# Patient Record
Sex: Female | Born: 1970 | Race: White | Hispanic: Yes | Marital: Married
Health system: Southern US, Community
[De-identification: ages and names within clinical notes are randomized; demographics above are authoritative.]

## PROBLEM LIST (undated history)

## (undated) DIAGNOSIS — C801 Malignant (primary) neoplasm, unspecified: Secondary | ICD-10-CM

## (undated) DIAGNOSIS — C229 Malignant neoplasm of liver, not specified as primary or secondary: Secondary | ICD-10-CM

---

## 2021-05-12 ENCOUNTER — Ambulatory Visit: Payer: Self-pay | Admitting: Nurse Practitioner

## 2021-09-29 ENCOUNTER — Encounter (HOSPITAL_BASED_OUTPATIENT_CLINIC_OR_DEPARTMENT_OTHER): Payer: Self-pay | Admitting: Emergency Medicine

## 2021-09-29 ENCOUNTER — Emergency Department (HOSPITAL_BASED_OUTPATIENT_CLINIC_OR_DEPARTMENT_OTHER)
Admission: EM | Admit: 2021-09-29 | Discharge: 2021-09-30 | Disposition: A | Discharge status: Other Acute Inpatient Hospital | Payer: Medicaid Other | Attending: Emergency Medicine | Admitting: Emergency Medicine

## 2021-09-29 ENCOUNTER — Other Ambulatory Visit: Payer: Self-pay

## 2021-09-29 ENCOUNTER — Emergency Department (HOSPITAL_BASED_OUTPATIENT_CLINIC_OR_DEPARTMENT_OTHER): Payer: Medicaid Other | Admitting: Radiology

## 2021-09-29 ENCOUNTER — Emergency Department (HOSPITAL_BASED_OUTPATIENT_CLINIC_OR_DEPARTMENT_OTHER): Payer: Medicaid Other

## 2021-09-29 DIAGNOSIS — R509 Fever, unspecified: Secondary | ICD-10-CM

## 2021-09-29 DIAGNOSIS — Z8509 Personal history of malignant neoplasm of other digestive organs: Secondary | ICD-10-CM | POA: Insufficient documentation

## 2021-09-29 DIAGNOSIS — R1011 Right upper quadrant pain: Secondary | ICD-10-CM | POA: Diagnosis not present

## 2021-09-29 DIAGNOSIS — R1013 Epigastric pain: Secondary | ICD-10-CM | POA: Diagnosis not present

## 2021-09-29 DIAGNOSIS — Z20822 Contact with and (suspected) exposure to covid-19: Secondary | ICD-10-CM | POA: Insufficient documentation

## 2021-09-29 HISTORY — DX: Malignant (primary) neoplasm, unspecified: C80.1

## 2021-09-29 LAB — URINALYSIS, ROUTINE W REFLEX MICROSCOPIC
Bilirubin Urine: NEGATIVE
Glucose, UA: NEGATIVE mg/dL
Ketones, ur: NEGATIVE mg/dL
Leukocytes,Ua: NEGATIVE
Nitrite: NEGATIVE
Specific Gravity, Urine: 1.013 (ref 1.005–1.030)
pH: 7 (ref 5.0–8.0)

## 2021-09-29 LAB — CBC WITH DIFFERENTIAL/PLATELET
Abs Immature Granulocytes: 0.02 10*3/uL (ref 0.00–0.07)
Basophils Absolute: 0 10*3/uL (ref 0.0–0.1)
Basophils Relative: 0 %
Eosinophils Absolute: 0 10*3/uL (ref 0.0–0.5)
Eosinophils Relative: 1 %
HCT: 37.5 % (ref 36.0–46.0)
Hemoglobin: 12.2 g/dL (ref 12.0–15.0)
Immature Granulocytes: 0 %
Lymphocytes Relative: 11 %
Lymphs Abs: 0.8 10*3/uL (ref 0.7–4.0)
MCH: 28.3 pg (ref 26.0–34.0)
MCHC: 32.5 g/dL (ref 30.0–36.0)
MCV: 87 fL (ref 80.0–100.0)
Monocytes Absolute: 0.9 10*3/uL (ref 0.1–1.0)
Monocytes Relative: 13 %
Neutro Abs: 5.1 10*3/uL (ref 1.7–7.7)
Neutrophils Relative %: 75 %
Platelets: 152 10*3/uL (ref 150–400)
RBC: 4.31 MIL/uL (ref 3.87–5.11)
RDW: 16.2 % — ABNORMAL HIGH (ref 11.5–15.5)
WBC: 6.8 10*3/uL (ref 4.0–10.5)
nRBC: 0 % (ref 0.0–0.2)

## 2021-09-29 LAB — COMPREHENSIVE METABOLIC PANEL
ALT: 120 U/L — ABNORMAL HIGH (ref 0–44)
AST: 72 U/L — ABNORMAL HIGH (ref 15–41)
Albumin: 4 g/dL (ref 3.5–5.0)
Alkaline Phosphatase: 406 U/L — ABNORMAL HIGH (ref 38–126)
Anion gap: 10 (ref 5–15)
BUN: 8 mg/dL (ref 6–20)
CO2: 23 mmol/L (ref 22–32)
Calcium: 10 mg/dL (ref 8.9–10.3)
Chloride: 101 mmol/L (ref 98–111)
Creatinine, Ser: 0.9 mg/dL (ref 0.44–1.00)
GFR, Estimated: >60 mL/min (ref >60)
Glucose, Bld: 111 mg/dL — ABNORMAL HIGH (ref 70–99)
Potassium: 3.6 mmol/L (ref 3.5–5.1)
Sodium: 134 mmol/L — ABNORMAL LOW (ref 135–145)
Total Bilirubin: 0.8 mg/dL (ref 0.3–1.2)
Total Protein: 7.2 g/dL (ref 6.5–8.1)

## 2021-09-29 LAB — RESP PANEL BY RT-PCR (FLU A&B, COVID) ARPGX2
Influenza A by PCR: NEGATIVE
Influenza B by PCR: NEGATIVE
SARS Coronavirus 2 by RT PCR: NEGATIVE

## 2021-09-29 LAB — PROTIME-INR
INR: 1 (ref 0.8–1.2)
Prothrombin Time: 12.6 seconds (ref 11.4–15.2)

## 2021-09-29 LAB — HCG, SERUM, QUALITATIVE: Preg, Serum: NEGATIVE

## 2021-09-29 LAB — LACTIC ACID, PLASMA: Lactic Acid, Venous: 0.7 mmol/L (ref 0.5–1.9)

## 2021-09-29 MED ORDER — ACETAMINOPHEN 325 MG PO TABS
650.0000 mg | ORAL_TABLET | Freq: Once | ORAL | Status: DC
Start: 2021-09-29 — End: 2021-09-30
  Filled 2021-09-29: qty 2

## 2021-09-29 MED ORDER — CEFTRIAXONE SODIUM 1 G IJ SOLR
1.0000 g | Freq: Once | INTRAMUSCULAR | Status: AC
  Administered 2021-09-29: 1 g via INTRAVENOUS
  Filled 2021-09-29: qty 10

## 2021-09-29 MED ORDER — METRONIDAZOLE 500 MG/100ML IV SOLN
500.0000 mg | Freq: Once | INTRAVENOUS | Status: AC
  Administered 2021-09-29: 500 mg via INTRAVENOUS
  Filled 2021-09-29: qty 100

## 2021-09-29 MED ORDER — IOHEXOL 300 MG/ML  SOLN
100.0000 mL | Freq: Once | INTRAMUSCULAR | Status: AC | PRN
  Administered 2021-09-29: 75 mL via INTRAVENOUS

## 2021-09-29 MED ORDER — VANCOMYCIN HCL 500 MG IV SOLR
500.0000 mg | Freq: Two times a day (BID) | INTRAVENOUS | Status: DC
  Administered 2021-09-30: 500 mg via INTRAVENOUS

## 2021-09-29 MED ORDER — VANCOMYCIN HCL IN DEXTROSE 1-5 GM/200ML-% IV SOLN
1000.0000 mg | Freq: Once | INTRAVENOUS | Status: AC
  Administered 2021-09-29: 1000 mg via INTRAVENOUS
  Filled 2021-09-29: qty 200

## 2021-09-29 MED ORDER — SODIUM CHLORIDE 0.9 % IV BOLUS
30.0000 mL/kg | Freq: Once | INTRAVENOUS | Status: AC
  Administered 2021-09-29: 1000 mL via INTRAVENOUS

## 2021-09-29 NOTE — ED Notes (Signed)
Handoff report given to Lequita Asal at [facility] ER. ?

## 2021-09-29 NOTE — ED Notes (Signed)
Patient transported to CT 

## 2021-09-29 NOTE — ED Notes (Signed)
Md made aware of temp 100.41F // VO for tylenol given per Gloris Manchester ?

## 2021-09-29 NOTE — ED Provider Notes (Signed)
Discussed with [facility] transfer service, they will accept her in ED to ED transfer to [place] accepting physician is Dr. Renda Rolls of the [facility] oncology service.  Also discussed with GI Dr. Cephas Darby who concurs with patient being admitted to oncology service GI will consult. ? ?Patient's labs are reassuring lactic acid not elevated white blood cell count not elevated.  But patient with significant fevers tachycardia and increased respiratory rate chest x-ray was negative as well.  Patient was treated with broad-spectrum antibiotics Vanco Rocephin and Flagyl and a 30 cc/kg fluid bolus.  By my daytime emergency physician colleague.  COVID testing is negative ? ?Patient known to have a history of cholangiocarcinoma.  The concern is for some cholangitis.  Patient does have a biliary stent in place.  That drains into the duodenum.  He just had an ERCP done at [facility] on April 24. ? ?CRITICAL CARE ?Performed by: Fredia Sorrow ?Total critical care time: 35 minutes ?Critical care time was exclusive of separately billable procedures and treating other patients. ?Critical care was necessary to treat or prevent imminent or life-threatening deterioration. ?Critical care was time spent personally by me on the following activities: development of treatment plan with patient and/or surrogate as well as nursing, discussions with consultants, evaluation of patient's response to treatment, examination of patient, obtaining history from patient or surrogate, ordering and performing treatments and interventions, ordering and review of laboratory studies, ordering and review of radiographic studies, pulse oximetry and re-evaluation of patient's condition. ? ?  ?Fredia Sorrow, MD ?09/29/21 1710 ? ?

## 2021-09-29 NOTE — ED Triage Notes (Signed)
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 Pt reports increasingly worse abdominal pain. Pt reports fever a 101.5 this morning before coming to ED Pt is worried about sepsis.  ?

## 2021-09-29 NOTE — ED Provider Notes (Signed)
?[place] EMERGENCY DEPT ?Provider Note ? ? ?CSN: [id number] ?Arrival date & time: 09/29/21  0906 ? ?  ? ?History ? ?Chief Complaint  ?Patient presents with  ? Abdominal Pain  ? ? ?Melinda White is a 51 y.o. female with a history of cholangiocarcinoma treated by [facility] oncology, s/p cholecystectomy, recent ERCP 09/26/21 at [facility] with biliary and hepatic stenting, presenting to ED with fevers and abdominal pain.  She reports he began having symptoms approximately 2 weeks ago.  Supplemental history provided by her husband at bedside.  She reports that she has been having fevers nearly daily for 2 weeks, and also was having epigastric and right upper quadrant abdominal pain.  She does not have persistent pain from her malignancy, it has been doing well otherwise until this began 2 weeks ago.  She says it feels similar to when she had a "infection in my bile stents".  They underwent ERCP 3 days ago at [facility], to have her biliary stents replaced, as it was felt that the stents have been in for too long. ? ?The patient was also evaluated for fevers approximately 10 days ago at [facility], had blood cultures drawn on 09/19/21 with no growth. ? ?Her most recent weekly chemoinfusion was held last week due to these fevers. ? ?ERCP report from [facility]where, [facility], ERCP impression: ? ?Impression: - Two stents from the biliary tree were seen in the  ?major papilla, removed with a snare. ?- Prior biliary sphincterotomy appeared open. ?- A single moderate biliary stricture was found. The  ?stricture was malignant appearing. ?- The the left and right intrahepatic ducts were  ?moderately dilated, with a mass causing an obstruction. ?- The middle third of the main bile duct and lower  ?third of the main bile duct were mildly dilated,  ?acquired. ?- The left main hepatic duct was successfully dilated. ?- The right main hepatic duct was successfully dilated. ?- The biliary tree was swept and debris was found. ?- One plastic  pancreatic stent was placed into the  ?left hepatic duct. ?- One plastic biliary stent was placed into the right  ?hepatic duct. ? ?Recommendation: - Discharge patient to home (via wheelchair). ?- Repeat ERCP in 3-4 months to exchange stent. ?- Cipro (ciprofloxacin) 500 mg PO BID for 5 days. ?- Watch for pancreatitis, bleeding, perforation, and  ?cholangitis. ?- The findings and recommendations were discussed with  ?the patient and their family. ?Attending Participation: ?I personally performed the entire procedure. ?Kathreen Cosier, MD ? ?HPI ? ?  ? ?Home Medications ?Prior to Admission medications   ?Not on File  ?   ? ?Allergies    ?Reglan [metoclopramide] and Penicillins   ? ?Review of Systems   ?Review of Systems ? ?Physical Exam ?Updated Vital Signs ?BP (!) 127/93   Pulse (!) 115   Temp (!) 101 ?F (38.3 ?C) (Oral)   Resp 15   Ht 5' 6"  (1.676 m)   Wt 55.8 kg   SpO2 98%   BMI 19.85 kg/m?  ?Physical Exam ?Constitutional:   ?   General: She is not in acute distress. ?HENT:  ?   Head: Normocephalic and atraumatic.  ?Eyes:  ?   Conjunctiva/sclera: Conjunctivae normal.  ?   Pupils: Pupils are equal, round, and reactive to light.  ?Cardiovascular:  ?   Rate and Rhythm: Normal rate and regular rhythm.  ?Pulmonary:  ?   Effort: Pulmonary effort is normal. No respiratory distress.  ?Abdominal:  ?   General: There is  no distension.  ?   Tenderness: There is abdominal tenderness in the epigastric area.  ?Skin: ?   General: Skin is warm and dry.  ?Neurological:  ?   General: No focal deficit present.  ?   Mental Status: She is alert. Mental status is at baseline.  ?Psychiatric:     ?   Mood and Affect: Mood normal.     ?   Behavior: Behavior normal.  ? ? ?ED Results / Procedures / Treatments   ?Labs ?(all labs ordered are listed, but only abnormal results are displayed) ?Labs Reviewed  ?CULTURE, BLOOD (ROUTINE X 2)  ?CULTURE, BLOOD (ROUTINE X 2)  ?COMPREHENSIVE METABOLIC PANEL  ?LACTIC ACID, PLASMA  ?LACTIC ACID,  PLASMA  ?CBC WITH DIFFERENTIAL/PLATELET  ?PROTIME-INR  ?URINALYSIS, ROUTINE W REFLEX MICROSCOPIC  ?HCG, SERUM, QUALITATIVE  ? ? ?EKG ?None ? ?Radiology ?No results found. ? ?Procedures ?Marland KitchenCritical Care ?Performed by: Wyvonnia Dusky, MD ?Authorized by: Wyvonnia Dusky, MD  ? ?Critical care provider statement:  ?  Critical care time (minutes):  45 ?  Critical care time was exclusive of:  Separately billable procedures and treating other patients ?  Critical care was necessary to treat or prevent imminent or life-threatening deterioration of the following conditions:  Sepsis ?  Critical care was time spent personally by me on the following activities:  Ordering and performing treatments and interventions, ordering and review of laboratory studies, ordering and review of radiographic studies, pulse oximetry, review of old charts, examination of patient and evaluation of patient's response to treatment ?  Care discussed with: admitting provider    ? ? ?Medications Ordered in ED ?Medications - No data to display ? ?ED Course/ Medical Decision Making/ A&P ?Clinical Course as of 09/30/21 1059  ?Thu Sep 29, 2021  ?1006 Patient notes she has been on ciprofloxacin for several days, has 3 more days of her prescription left, to complete a 10-day course. [MT]  ?1044 AST/ALT downtrending from 09/19/21 [facility] records, and have been stable in the past 3 weeks.  Alk Phos mildly elevated but nonspecific - she is s/p cholecystectomy. [MT]  ?8264 I ordered a CT scan to evaluate for intra-abdominal infection or abscess.  The patient and her husband are in agreement with the plan.  She reports her pain overall has improved and is quite minimal at this time [MT]  ?1419 CT scan shows no significant findings or abnormalities to change from prior scan to suggest intra-abdominal abscess.  I discussed with the patient and her husband a plan for medical admission on antibiotics for sepsis rule out and for echocardiogram to evaluate for  endocarditis, given that she does have a central line for chemo infusions, which would be a potential source of infection.  I have not otherwise identified any other source of her fever and tachycardia.  She is not hypoxic to suggest acute pulmonary embolism, and this would less likely cause fevers for 2 weeks.  I would like to avoid another CT scan with contrast at this time and I will defer decision for PE workup to the inpatient team.  Patient and husband are agreeable for admission [MT]  ?Bryce at [facility] and GI Dr Johney Frame [facility]) have both requested that I discuss case with [facility] for transfer -patient and her husband initially had requested medical admission at [facility] but now are agreeable to transfer if this is an option.  Will contact [facility] [MT]  ?1545 Pt signed out to Dr Rogene Houston EDP pending conversations  with [facility] for transfer.  Awaiting callback from [place] initially. [MT]  ?  ?Clinical Course User Index ?[MT] Wyvonnia Dusky, MD  ? ?                        ?Medical Decision Making ?Amount and/or Complexity of Data Reviewed ?Labs: ordered. ?Radiology: ordered. ? ?Risk ?Prescription drug management. ? ? ?This patient presents to the ED with concern for fever, abdominal pain. This involves an extensive number of treatment options, and is a complaint that carries with it a high risk of complications and morbidity.  The differential diagnosis includes sepsis versus chemotherapy side effects versus biliary infection versus other infection ? ?Presentation could be concerning for sepsis with tachycardia and fever.  Blood cultures will be sent.  Her blood cultures last week at [facility] were no growth to date per my review of external records. ? ?Her abdominal pain appears to be the new symptom, and is difficult to discern whether this would be expected postoperative with her recent ERCP, although she does note that she had pain preceding the ERCP. ? ?Co-morbidities that complicate the patient evaluation:  Immunocompromise that she is on chemotherapy ? ?Additional history obtained from patient's husband at bedside ? ?External records from outside source obtained and reviewed including [facility] oncology records, recent ERCP report,

## 2021-09-29 NOTE — Progress Notes (Signed)
Pharmacy Antibiotic Note ? ?Melinda White is a 51 y.o. female admitted on 09/29/2021 presenting with fevers and abdominal pain, hx ERCP recently with biliary stents.  Pharmacy has been consulted for vancomycin dosing.  Ceftriaxone per MD ? ?Plan: ?Vancomycin 1000 mg IV x 1, then 500 mg IV q 12h  ?Monitor renal function, Cx and clinical progression to narrow ?Vancomycin levels as needed ? ?Height: '5\' 6"'$  (167.6 cm) ?Weight: 55.8 kg (123 lb) ?IBW/kg (Calculated) : 59.3 ? ?Temp (24hrs), Avg:101.1 ?F (38.4 ?C), Min:101 ?F (38.3 ?C), Max:101.1 ?F (38.4 ?C) ? ?Recent Labs  ?Lab 09/29/21 ?5945  ?WBC 6.8  ?CREATININE 0.90  ?LATICACIDVEN 0.7  ?  ?Estimated Creatinine Clearance: 67.3 mL/min (by C-G formula based on SCr of 0.9 mg/dL).   ? ?Allergies  ?Allergen Reactions  ? Reglan [Metoclopramide] Anaphylaxis  ? Penicillins Rash  ? ? ?Bertis Ruddy, PharmD ?Clinical Pharmacist ?ED Pharmacist Phone # [phone] ?09/29/2021 2:29 PM ? ? ?

## 2021-09-29 NOTE — ED Notes (Addendum)
Pt refused to take tylenol. Pt educated on why she was receiving tylenol. Pt then stated that she does not take tylenol because of the liver CA. RN then asked what do she normally take for a elevated temp and she then stated nothing. Pt denies wanting to take anything for elevated temp at this time. ?

## 2021-09-30 LAB — BLOOD CULTURE ID PANEL (REFLEXED) - BCID2

## 2021-09-30 NOTE — ED Notes (Addendum)
Report given to [facility]  ?

## 2021-10-02 LAB — CULTURE, BLOOD (ROUTINE X 2): Special Requests: ADEQUATE

## 2021-10-03 LAB — CULTURE, BLOOD (ROUTINE X 2): Special Requests: ADEQUATE

## 2021-10-10 ENCOUNTER — Other Ambulatory Visit (HOSPITAL_COMMUNITY)
Admission: RE | Admit: 2021-10-10 | Discharge: 2021-10-10 | Disposition: A | Discharge status: Home / Self Care | Payer: Medicaid Other | Source: Ambulatory Visit | Attending: Emergency Medicine | Admitting: Emergency Medicine

## 2021-10-10 DIAGNOSIS — Z01812 Encounter for preprocedural laboratory examination: Secondary | ICD-10-CM | POA: Diagnosis present

## 2021-10-10 LAB — CBC WITH DIFFERENTIAL/PLATELET
Abs Immature Granulocytes: 0.01 10*3/uL (ref 0.00–0.07)
Basophils Absolute: 0 10*3/uL (ref 0.0–0.1)
Basophils Relative: 1 %
Eosinophils Absolute: 0.2 10*3/uL (ref 0.0–0.5)
Eosinophils Relative: 4 %
HCT: 34.4 % — ABNORMAL LOW (ref 36.0–46.0)
Hemoglobin: 11.2 g/dL — ABNORMAL LOW (ref 12.0–15.0)
Immature Granulocytes: 0 %
Lymphocytes Relative: 42 %
Lymphs Abs: 2.2 10*3/uL (ref 0.7–4.0)
MCH: 29.1 pg (ref 26.0–34.0)
MCHC: 32.6 g/dL (ref 30.0–36.0)
MCV: 89.4 fL (ref 80.0–100.0)
Monocytes Absolute: 0.7 10*3/uL (ref 0.1–1.0)
Monocytes Relative: 13 %
Neutro Abs: 2.1 10*3/uL (ref 1.7–7.7)
Neutrophils Relative %: 40 %
Platelets: 271 10*3/uL (ref 150–400)
RBC: 3.85 MIL/uL — ABNORMAL LOW (ref 3.87–5.11)
RDW: 15.9 % — ABNORMAL HIGH (ref 11.5–15.5)
WBC: 5.2 10*3/uL (ref 4.0–10.5)
nRBC: 0 % (ref 0.0–0.2)

## 2021-10-10 LAB — COMPREHENSIVE METABOLIC PANEL
ALT: 39 U/L (ref 0–44)
AST: 40 U/L (ref 15–41)
Albumin: 3.3 g/dL — ABNORMAL LOW (ref 3.5–5.0)
Alkaline Phosphatase: 351 U/L — ABNORMAL HIGH (ref 38–126)
Anion gap: 7 (ref 5–15)
BUN: 6 mg/dL (ref 6–20)
CO2: 28 mmol/L (ref 22–32)
Calcium: 9.2 mg/dL (ref 8.9–10.3)
Chloride: 106 mmol/L (ref 98–111)
Creatinine, Ser: 0.88 mg/dL (ref 0.44–1.00)
GFR, Estimated: >60 mL/min (ref >60)
Glucose, Bld: 102 mg/dL — ABNORMAL HIGH (ref 70–99)
Potassium: 3.9 mmol/L (ref 3.5–5.1)
Sodium: 141 mmol/L (ref 135–145)
Total Bilirubin: 0.2 mg/dL — ABNORMAL LOW (ref 0.3–1.2)
Total Protein: 6.5 g/dL (ref 6.5–8.1)

## 2021-10-14 ENCOUNTER — Other Ambulatory Visit: Payer: Self-pay

## 2021-10-14 ENCOUNTER — Emergency Department (HOSPITAL_BASED_OUTPATIENT_CLINIC_OR_DEPARTMENT_OTHER)
Admission: EM | Admit: 2021-10-14 | Discharge: 2021-10-14 | Disposition: A | Discharge status: Other Acute Inpatient Hospital | Payer: Medicaid Other | Attending: Emergency Medicine | Admitting: Emergency Medicine

## 2021-10-14 ENCOUNTER — Emergency Department (HOSPITAL_BASED_OUTPATIENT_CLINIC_OR_DEPARTMENT_OTHER): Payer: Medicaid Other | Admitting: Radiology

## 2021-10-14 ENCOUNTER — Emergency Department (HOSPITAL_BASED_OUTPATIENT_CLINIC_OR_DEPARTMENT_OTHER): Payer: Medicaid Other

## 2021-10-14 ENCOUNTER — Encounter (HOSPITAL_BASED_OUTPATIENT_CLINIC_OR_DEPARTMENT_OTHER): Payer: Self-pay

## 2021-10-14 DIAGNOSIS — R7989 Other specified abnormal findings of blood chemistry: Secondary | ICD-10-CM | POA: Insufficient documentation

## 2021-10-14 DIAGNOSIS — C221 Intrahepatic bile duct carcinoma: Secondary | ICD-10-CM | POA: Diagnosis not present

## 2021-10-14 DIAGNOSIS — R509 Fever, unspecified: Secondary | ICD-10-CM

## 2021-10-14 DIAGNOSIS — D72829 Elevated white blood cell count, unspecified: Secondary | ICD-10-CM | POA: Diagnosis not present

## 2021-10-14 DIAGNOSIS — R1011 Right upper quadrant pain: Secondary | ICD-10-CM | POA: Diagnosis present

## 2021-10-14 DIAGNOSIS — R000 Tachycardia, unspecified: Secondary | ICD-10-CM | POA: Diagnosis not present

## 2021-10-14 LAB — COMPREHENSIVE METABOLIC PANEL
ALT: 39 U/L (ref 0–44)
AST: 57 U/L — ABNORMAL HIGH (ref 15–41)
Albumin: 4.3 g/dL (ref 3.5–5.0)
Alkaline Phosphatase: 358 U/L — ABNORMAL HIGH (ref 38–126)
Anion gap: 10 (ref 5–15)
BUN: 9 mg/dL (ref 6–20)
CO2: 23 mmol/L (ref 22–32)
Calcium: 9.4 mg/dL (ref 8.9–10.3)
Chloride: 104 mmol/L (ref 98–111)
Creatinine, Ser: 0.82 mg/dL (ref 0.44–1.00)
GFR, Estimated: >60 mL/min (ref >60)
Glucose, Bld: 106 mg/dL — ABNORMAL HIGH (ref 70–99)
Potassium: 3.8 mmol/L (ref 3.5–5.1)
Sodium: 137 mmol/L (ref 135–145)
Total Bilirubin: 0.5 mg/dL (ref 0.3–1.2)
Total Protein: 7.2 g/dL (ref 6.5–8.1)

## 2021-10-14 LAB — CBC WITH DIFFERENTIAL/PLATELET
Abs Immature Granulocytes: 0.03 10*3/uL (ref 0.00–0.07)
Basophils Absolute: 0 10*3/uL (ref 0.0–0.1)
Basophils Relative: 0 %
Eosinophils Absolute: 0 10*3/uL (ref 0.0–0.5)
Eosinophils Relative: 0 %
HCT: 35.8 % — ABNORMAL LOW (ref 36.0–46.0)
Hemoglobin: 11.5 g/dL — ABNORMAL LOW (ref 12.0–15.0)
Immature Granulocytes: 0 %
Lymphocytes Relative: 9 %
Lymphs Abs: 1.3 10*3/uL (ref 0.7–4.0)
MCH: 27.6 pg (ref 26.0–34.0)
MCHC: 32.1 g/dL (ref 30.0–36.0)
MCV: 86.1 fL (ref 80.0–100.0)
Monocytes Absolute: 1.2 10*3/uL — ABNORMAL HIGH (ref 0.1–1.0)
Monocytes Relative: 9 %
Neutro Abs: 10.9 10*3/uL — ABNORMAL HIGH (ref 1.7–7.7)
Neutrophils Relative %: 82 %
Platelets: 261 10*3/uL (ref 150–400)
RBC: 4.16 MIL/uL (ref 3.87–5.11)
RDW: 15.3 % (ref 11.5–15.5)
WBC: 13.4 10*3/uL — ABNORMAL HIGH (ref 4.0–10.5)
nRBC: 0 % (ref 0.0–0.2)

## 2021-10-14 LAB — PROTIME-INR
INR: 1 (ref 0.8–1.2)
Prothrombin Time: 12.9 seconds (ref 11.4–15.2)

## 2021-10-14 LAB — URINALYSIS, ROUTINE W REFLEX MICROSCOPIC
Bilirubin Urine: NEGATIVE
Glucose, UA: NEGATIVE mg/dL
Hgb urine dipstick: NEGATIVE
Ketones, ur: NEGATIVE mg/dL
Leukocytes,Ua: NEGATIVE
Nitrite: NEGATIVE
Protein, ur: NEGATIVE mg/dL
Specific Gravity, Urine: 1.013 (ref 1.005–1.030)
pH: 8 (ref 5.0–8.0)

## 2021-10-14 LAB — LACTIC ACID, PLASMA
Lactic Acid, Venous: 1 mmol/L (ref 0.5–1.9)
Lactic Acid, Venous: 1.1 mmol/L (ref 0.5–1.9)

## 2021-10-14 LAB — PREGNANCY, URINE: Preg Test, Ur: NEGATIVE

## 2021-10-14 LAB — LIPASE, BLOOD: Lipase: 23 U/L (ref 11–51)

## 2021-10-14 MED ORDER — ACETAMINOPHEN 325 MG PO TABS: 650.0000 mg | ORAL_TABLET | Freq: Once | ORAL | Status: DC

## 2021-10-14 MED ORDER — IOHEXOL 300 MG/ML  SOLN
100.0000 mL | Freq: Once | INTRAMUSCULAR | Status: AC | PRN
  Administered 2021-10-14: 80 mL via INTRAVENOUS

## 2021-10-14 MED ORDER — METRONIDAZOLE 500 MG/100ML IV SOLN
500.0000 mg | Freq: Two times a day (BID) | INTRAVENOUS | Status: DC
  Administered 2021-10-14: 500 mg via INTRAVENOUS
  Filled 2021-10-14: qty 100

## 2021-10-14 MED ORDER — IBUPROFEN 400 MG PO TABS
600.0000 mg | ORAL_TABLET | Freq: Once | ORAL | Status: AC
  Administered 2021-10-14: 600 mg via ORAL
  Filled 2021-10-14: qty 1

## 2021-10-14 MED ORDER — SODIUM CHLORIDE 0.9 % IV SOLN
INTRAVENOUS | Status: DC | PRN
  Administered 2021-10-14: 10 mL via INTRAVENOUS

## 2021-10-14 MED ORDER — SODIUM CHLORIDE 0.9 % IV SOLN: 2.0000 g | Freq: Three times a day (TID) | INTRAVENOUS | Status: DC

## 2021-10-14 MED ORDER — LACTATED RINGERS IV BOLUS
1000.0000 mL | Freq: Once | INTRAVENOUS | Status: AC
  Administered 2021-10-14: 1000 mL via INTRAVENOUS

## 2021-10-14 MED ORDER — VANCOMYCIN HCL IN DEXTROSE 1-5 GM/200ML-% IV SOLN
1000.0000 mg | Freq: Once | INTRAVENOUS | Status: AC
  Administered 2021-10-14: 1000 mg via INTRAVENOUS
  Filled 2021-10-14: qty 200

## 2021-10-14 MED ORDER — VANCOMYCIN HCL 750 MG/150ML IV SOLN
750.0000 mg | Freq: Two times a day (BID) | INTRAVENOUS | Status: DC
  Filled 2021-10-14: qty 150

## 2021-10-14 MED ORDER — SODIUM CHLORIDE 0.9 % IV SOLN
2.0000 g | Freq: Once | INTRAVENOUS | Status: AC
  Administered 2021-10-14: 2 g via INTRAVENOUS
  Filled 2021-10-14: qty 12.5

## 2021-10-14 NOTE — ED Notes (Addendum)
Patient transported to CT. Pt said she could not take tylenol for fever due to liver issues ? ?

## 2021-10-14 NOTE — ED Notes (Addendum)
Pt stated that she thought she was having an allergic reaction to the Vanc. Pt stated that the palms of her hands are red and itching. Pt denis Pain or SOB. Infusion stopped. MD notified Hr sligtly increased from baseline since she has been her. increased  ?

## 2021-10-14 NOTE — ED Provider Notes (Signed)
?[name] ?Provider Note ? ? ?CSN: [id number] ?Arrival date & time: 10/14/21  1419 ? ?  ? ?History ? ?Chief Complaint  ?Patient presents with  ? Code Sepsis  ? ? ?Melinda White is a 51 y.o. female. ? ?HPI ?Patient denies a fever abdominal pain.  Has had recent admission to hospital with bacteremia with concern for cholangitis.  Had positive blood cultures with Enterococcus and Bacteroides had been on p.o. Augmentin up until 3 days ago.  Had been doing well with some pain but states that it was just a similar pain she been having.  Now pain more in her right upper abdomen.  Is on chemotherapy for cholangiocarcinoma.  Is managed at [facility]  Fevers reportedly went up to 103 today with worsening pain from her stents.  No cough.  No dysuria.  Patient discussed with her doctors at [facility] and was told to come to the ER. ?  ?Past Medical History:  ?Diagnosis Date  ? Cancer [facility])   ? ?No past surgical history on file. ? ?Home Medications ?Prior to Admission medications   ?Medication Sig Start Date End Date Taking? Authorizing Provider  ?Cholecalciferol 50 MCG (2000 UT) CAPS Take 2,000 Units by mouth in the morning.    [provider]  ?ciprofloxacin (CIPRO) 500 MG tablet Take 500 mg by mouth 2 (two) times daily. 09/26/21   [provider]  ?dexamethasone (DECADRON) 4 MG tablet Take 2 tablets by mouth on days 2, 3 after chemo in the morning 10/21/20   [provider]  ?fexofenadine (ALLEGRA) 180 MG tablet Take 90 mg by mouth as needed.    [provider]  ?lidocaine-prilocaine (EMLA) cream Chemo days 12/27/20   [provider]  ?OLANZapine (ZYPREXA) 5 MG tablet Take 5 mg by mouth at bedtime.    [provider]  ?ondansetron (ZOFRAN-ODT) 8 MG disintegrating tablet Take 8 mg by mouth every 8 (eight) hours. On chemo days 04/15/21   [provider]  ?pantoprazole (PROTONIX) 40 MG tablet Take 40 mg by mouth daily. 09/14/21   [provider]  ?polyethylene glycol powder (GLYCOLAX/MIRALAX) 17 GM/SCOOP powder Take 17 g by mouth 2 (two) times daily. 09/27/20   [provider]  ?senna (SENOKOT) 8.6 MG tablet Take 1 tablet by mouth 2 (two) times daily. 09/27/20   [provider]  ?sucralfate (CARAFATE) 1 g tablet Take 1 g by mouth 4 (four) times daily. 08/22/21   [provider]  ?   ? ?Allergies    ?Reglan [metoclopramide] and Penicillins   ? ?Review of Systems   ?Review of Systems  ?Constitutional:  Positive for chills, fatigue and fever.  ?Respiratory:  Negative for shortness of breath.   ?Gastrointestinal:  Positive for abdominal pain.  ?Genitourinary:  Positive for flank pain.  ?Musculoskeletal:  Positive for back pain.  ?Skin:  Negative for rash.  ?Neurological:  Negative for weakness.  ? ?Physical Exam ?Updated Vital Signs ?BP (!) 141/97   Pulse (!) 108   Temp (!) 100.6 ?F (38.1 ?C)   Resp (!) 22   Ht '5\' 6"'$  (1.676 m)   Wt 55.8 kg   SpO2 99%   BMI 19.85 kg/m?  ?Physical Exam ?Vitals and nursing note reviewed.  ?Constitutional:   ?   Comments: Patient appears uncomfortable.  ?Cardiovascular:  ?   Rate and Rhythm: Tachycardia present.  ?Abdominal:  ?   Tenderness: There is abdominal tenderness.  ?   Comments: Right upper quadrant tenderness.  Also mild tenderness over pump and lower abdomen.  ?Musculoskeletal:     ?   General: No tenderness.  ?   Cervical back: Neck supple.  ?Skin: ?   General: Skin is warm.  ?   Capillary Refill: Capillary refill takes less than 2 seconds.  ?Neurological:  ?   Mental Status: She is alert and oriented to person, place, and time.  ? ? ?ED Results / Procedures / Treatments   ?Labs ?(all labs ordered are listed, but only abnormal results are displayed) ?Labs Reviewed  ?COMPREHENSIVE METABOLIC PANEL - Abnormal; Notable for the following components:  ?    Result Value  ? Glucose, Bld 106 (*)   ? AST 57 (*)   ? Alkaline Phosphatase 358 (*)   ? All other components within normal limits  ?CBC  WITH DIFFERENTIAL/PLATELET - Abnormal; Notable for the following components:  ? WBC 13.4 (*)   ? Hemoglobin 11.5 (*)   ? HCT 35.8 (*)   ? Neutro Abs 10.9 (*)   ? Monocytes Absolute 1.2 (*)   ? All other components within normal limits  ?CULTURE, BLOOD (ROUTINE X 2)  ?CULTURE, BLOOD (ROUTINE X 2)  ?LACTIC ACID, PLASMA  ?LACTIC ACID, PLASMA  ?PROTIME-INR  ?URINALYSIS, ROUTINE W REFLEX MICROSCOPIC  ?PREGNANCY, URINE  ?LIPASE, BLOOD  ? ? ?EKG ?None ? ?Radiology ?CT ABDOMEN PELVIS W CONTRAST ? ?Result Date: 10/14/2021 ?CLINICAL DATA:  Sepsis, bacteremia, finished antibiotics and started having fevers. Return of abdominal pain. Tmax 103 degrees. History of cholangiocarcinoma on chemotherapy EXAM: CT ABDOMEN AND PELVIS WITH CONTRAST TECHNIQUE: Multidetector CT imaging of the abdomen and pelvis was performed using the standard protocol following bolus administration of intravenous contrast. RADIATION DOSE REDUCTION: This exam was performed according to the departmental dose-optimization program which includes automated exposure control, adjustment of the mA and/or kV according to patient size and/or use of iterative reconstruction technique. CONTRAST:  93m OMNIPAQUE IOHEXOL 300 MG/ML SOLN IV. No oral contrast. COMPARISON:  09/29/2021 FINDINGS: Lower chest: Lung bases clear Hepatobiliary: Post cholecystectomy. CBD stents. Low-attenuation mass at porta hepatis consistent with cholangiocarcinoma, 4.1 x 3.2 cm previously 4.0 x 3.4 cm. Pancreas: Normal appearance Spleen: Normal appearance Adrenals/Urinary Tract: Small LEFT renal cyst 11 mm diameter at inferior pole; no follow-up imaging recommended. Adrenal glands, kidneys, ureters, and bladder otherwise normal appearance. Stomach/Bowel: Stomach decompressed. Bowel loops unremarkable. Appendix not visualized. Vascular/Lymphatic: Vascular structures patent.  No adenopathy. Reproductive: Unremarkable uterus and adnexa Other: No free air or free fluid. Infusion pump anterior  abdominal wall with catheter in RIGHT abdomen. Musculoskeletal: Unremarkable IMPRESSION: Low-attenuation mass at porta hepatis consistent with cholangiocarcinoma, 4.1 x 3.2 cm previously 4.0 x 3.4 cm. CBD stents. No new intra-abdominal or intrapelvic abnormalities. Electronically Signed   By: MLavonia DanaM.D.   On: 10/14/2021 16:22  ? ?DG Chest Port 1 View ? ?Result Date: 10/14/2021 ?CLINICAL DATA:  Fever EXAM: PORTABLE CHEST 1 VIEW COMPARISON:  09/29/2021 FINDINGS: Right-sided central venous port tip over the SVC. No focal opacity or pleural effusion. Normal cardiac size. No pneumothorax. IMPRESSION: No active disease. Electronically Signed   By: KDonavan FoilM.D.   On: 10/14/2021 16:11   ? ?Procedures ?Procedures  ? ? ?Medications Ordered in ED ?Medications  ?vancomycin (VANCOCIN) IVPB 1000 mg/200 mL premix (has no administration in time range)  ?metroNIDAZOLE (FLAGYL) IVPB 500 mg (500 mg Intravenous New Bag/Given 10/14/21 1713)  ?ceFEPIme (MAXIPIME) 2 g in sodium chloride 0.9 % 100 mL IVPB (has no administration in time  range)  ?vancomycin (VANCOREADY) IVPB 750 mg/150 mL (has no administration in time range)  ?0.9 %  sodium chloride infusion (0 mLs Intravenous Stopped 10/14/21 1709)  ?lactated ringers bolus 1,000 mL (1,000 mLs Intravenous New Bag/Given 10/14/21 1713)  ?ceFEPIme (MAXIPIME) 2 g in sodium chloride 0.9 % 100 mL IVPB (0 g Intravenous Stopped 10/14/21 1709)  ?iohexol (OMNIPAQUE) 300 MG/ML solution 100 mL (80 mLs Intravenous Contrast Given 10/14/21 1554)  ? ? ?ED Course/ Medical Decision Making/ A&P ?  ?                        ?Medical Decision Making ?Amount and/or Complexity of Data Reviewed ?Labs: ordered. ?Radiology: ordered. ? ?Risk ?Prescription drug management. ? ? ?Patient presents with a fever and worsening abdominal pain.  Recent admission to [facility] for a cholangitis with bacteremia.  Has cholangiocarcinoma.  Is on a chemotherapy pump.  Finished up Augmentin 3 days ago now developed fever.   Initial heart rate of 130 and was febrile here.  Reported fever up to 100.3 at home.  White count mildly elevated and LFTs are reassuring.  CT scan done and also overall reassuring.  Started on cefepime Vanco and [place]

## 2021-10-14 NOTE — Progress Notes (Signed)
Pharmacy Antibiotic Note ? ?Melinda White is a 51 y.o. female admitted on 10/14/2021 with sepsis.  Pharmacy has been consulted for vancomycin and cefepime dosing. Pt with recent enterococcus and bacteroides bacteremia treated at [facility] then discharged on augmentin which she completed 5/9. Now with new fevers.  ? ?Plan: ?Vancomycin 1gm IV x 1 then '750mg'$  IV Q12H ?Cefepime 2gm IV Q8H  ?F/u renal fxn, C&S, clinical status and peak/trough at [facility] ? ?Height: '5\' 6"'$  (167.6 cm) ?Weight: 55.8 kg (123 lb) ?IBW/kg (Calculated) : 59.3 ? ?Temp (24hrs), Avg:100.6 ?F (38.1 ?C), Min:100.6 ?F (38.1 ?C), Max:100.6 ?F (38.1 ?C) ? ?Recent Labs  ?Lab 10/10/21 ?1440 10/14/21 ?1500  ?WBC 5.2 13.4*  ?CREATININE 0.88  --   ?  ?Estimated Creatinine Clearance: 68.9 mL/min (by C-G formula based on SCr of 0.88 mg/dL).   ? ?Allergies  ?Allergen Reactions  ? Reglan [Metoclopramide] Anaphylaxis  ? Penicillins Rash  ? ? ?Antimicrobials this admission: ?Vanc 5/12>> ?Cefepime 5/12>> ?Flagyl 5/12>> ? ?Dose adjustments this admission: ?N/A ? ?Microbiology results: ?Pending ? ?Thank you for allowing pharmacy to be a part of this patient?s care. ? ?Evian Salguero, Rande Lawman ?10/14/2021 3:33 PM ? ?

## 2021-10-14 NOTE — ED Notes (Signed)
Pt preferred not to have second IV. LR not compatible with maxipime. Antibiotics started and LR will go with next two rounds of antibiotics as they are compatible with LR.  ?

## 2021-10-14 NOTE — ED Triage Notes (Signed)
Pt was recently admitted for bacteremia. Pt finished abx and started running fevers. Pt has a return of the abd pain. TMax of 103 at home. Pt is on chemo for CA in the bile duct.  ?

## 2021-10-14 NOTE — ED Notes (Addendum)
Pt awaiting transfer from [place] to [place]. Pt is stable but transport is backed up and ETA is unknown ?

## 2021-10-19 LAB — CULTURE, BLOOD (ROUTINE X 2)
Culture: NO GROWTH
Culture: NO GROWTH
Special Requests: ADEQUATE
Special Requests: ADEQUATE

## 2021-11-18 ENCOUNTER — Inpatient Hospital Stay (HOSPITAL_BASED_OUTPATIENT_CLINIC_OR_DEPARTMENT_OTHER)
Admission: EM | Admit: 2021-11-18 | Discharge: 2021-11-20 | DRG: 864 | Disposition: A | Discharge status: Home / Self Care | Payer: Medicaid Other | Attending: Internal Medicine | Admitting: Internal Medicine

## 2021-11-18 ENCOUNTER — Emergency Department (HOSPITAL_BASED_OUTPATIENT_CLINIC_OR_DEPARTMENT_OTHER): Payer: Medicaid Other | Admitting: Radiology

## 2021-11-18 ENCOUNTER — Encounter (HOSPITAL_BASED_OUTPATIENT_CLINIC_OR_DEPARTMENT_OTHER): Payer: Self-pay

## 2021-11-18 ENCOUNTER — Other Ambulatory Visit: Payer: Self-pay

## 2021-11-18 ENCOUNTER — Emergency Department (HOSPITAL_BASED_OUTPATIENT_CLINIC_OR_DEPARTMENT_OTHER): Payer: Medicaid Other

## 2021-11-18 DIAGNOSIS — Z880 Allergy status to penicillin: Secondary | ICD-10-CM

## 2021-11-18 DIAGNOSIS — B966 Bacteroides fragilis [B. fragilis] as the cause of diseases classified elsewhere: Secondary | ICD-10-CM | POA: Diagnosis present

## 2021-11-18 DIAGNOSIS — Z20822 Contact with and (suspected) exposure to covid-19: Secondary | ICD-10-CM | POA: Diagnosis present

## 2021-11-18 DIAGNOSIS — R509 Fever, unspecified: Principal | ICD-10-CM | POA: Diagnosis present

## 2021-11-18 DIAGNOSIS — K219 Gastro-esophageal reflux disease without esophagitis: Secondary | ICD-10-CM | POA: Diagnosis present

## 2021-11-18 DIAGNOSIS — Z79899 Other long term (current) drug therapy: Secondary | ICD-10-CM

## 2021-11-18 DIAGNOSIS — A419 Sepsis, unspecified organism: Secondary | ICD-10-CM | POA: Diagnosis present

## 2021-11-18 DIAGNOSIS — C221 Intrahepatic bile duct carcinoma: Secondary | ICD-10-CM | POA: Diagnosis present

## 2021-11-18 DIAGNOSIS — R6510 Systemic inflammatory response syndrome (SIRS) of non-infectious origin without acute organ dysfunction: Secondary | ICD-10-CM | POA: Diagnosis present

## 2021-11-18 DIAGNOSIS — D509 Iron deficiency anemia, unspecified: Secondary | ICD-10-CM | POA: Diagnosis present

## 2021-11-18 DIAGNOSIS — Z888 Allergy status to other drugs, medicaments and biological substances status: Secondary | ICD-10-CM

## 2021-11-18 DIAGNOSIS — B952 Enterococcus as the cause of diseases classified elsewhere: Secondary | ICD-10-CM | POA: Diagnosis present

## 2021-11-18 LAB — LIPASE, BLOOD: Lipase: 47 U/L (ref 11–51)

## 2021-11-18 LAB — COMPREHENSIVE METABOLIC PANEL
ALT: 98 U/L — ABNORMAL HIGH (ref 0–44)
AST: 77 U/L — ABNORMAL HIGH (ref 15–41)
Albumin: 4.2 g/dL (ref 3.5–5.0)
Alkaline Phosphatase: 172 U/L — ABNORMAL HIGH (ref 38–126)
Anion gap: 10 (ref 5–15)
BUN: 18 mg/dL (ref 6–20)
CO2: 24 mmol/L (ref 22–32)
Calcium: 9.8 mg/dL (ref 8.9–10.3)
Chloride: 101 mmol/L (ref 98–111)
Creatinine, Ser: 0.96 mg/dL (ref 0.44–1.00)
GFR, Estimated: >60 mL/min (ref >60)
Glucose, Bld: 112 mg/dL — ABNORMAL HIGH (ref 70–99)
Potassium: 3.8 mmol/L (ref 3.5–5.1)
Sodium: 135 mmol/L (ref 135–145)
Total Bilirubin: 0.9 mg/dL (ref 0.3–1.2)
Total Protein: 7.2 g/dL (ref 6.5–8.1)

## 2021-11-18 LAB — URINALYSIS, ROUTINE W REFLEX MICROSCOPIC
Bilirubin Urine: NEGATIVE
Glucose, UA: NEGATIVE mg/dL
Hgb urine dipstick: NEGATIVE
Ketones, ur: NEGATIVE mg/dL
Nitrite: NEGATIVE
Specific Gravity, Urine: 1.017 (ref 1.005–1.030)
pH: 7.5 (ref 5.0–8.0)

## 2021-11-18 LAB — PROTIME-INR
INR: 1 (ref 0.8–1.2)
Prothrombin Time: 13.2 seconds (ref 11.4–15.2)

## 2021-11-18 LAB — CBC WITH DIFFERENTIAL/PLATELET
Abs Immature Granulocytes: 0.03 10*3/uL (ref 0.00–0.07)
Basophils Absolute: 0 10*3/uL (ref 0.0–0.1)
Basophils Relative: 0 %
Eosinophils Absolute: 0 10*3/uL (ref 0.0–0.5)
Eosinophils Relative: 1 %
HCT: 32.6 % — ABNORMAL LOW (ref 36.0–46.0)
Hemoglobin: 10.3 g/dL — ABNORMAL LOW (ref 12.0–15.0)
Immature Granulocytes: 1 %
Lymphocytes Relative: 10 %
Lymphs Abs: 0.6 10*3/uL — ABNORMAL LOW (ref 0.7–4.0)
MCH: 27 pg (ref 26.0–34.0)
MCHC: 31.6 g/dL (ref 30.0–36.0)
MCV: 85.6 fL (ref 80.0–100.0)
Monocytes Absolute: 0.2 10*3/uL (ref 0.1–1.0)
Monocytes Relative: 2 %
Neutro Abs: 5.6 10*3/uL (ref 1.7–7.7)
Neutrophils Relative %: 86 %
Platelets: 197 10*3/uL (ref 150–400)
RBC: 3.81 MIL/uL — ABNORMAL LOW (ref 3.87–5.11)
RDW: 15.4 % (ref 11.5–15.5)
WBC: 6.4 10*3/uL (ref 4.0–10.5)
nRBC: 0 % (ref 0.0–0.2)

## 2021-11-18 LAB — PREGNANCY, URINE: Preg Test, Ur: NEGATIVE

## 2021-11-18 LAB — MRSA NEXT GEN BY PCR, NASAL: MRSA by PCR Next Gen: NOT DETECTED

## 2021-11-18 LAB — LACTIC ACID, PLASMA
Lactic Acid, Venous: 1.8 mmol/L (ref 0.5–1.9)
Lactic Acid, Venous: 1.4 mmol/L (ref 0.5–1.9)

## 2021-11-18 MED ORDER — IOHEXOL 300 MG/ML  SOLN
100.0000 mL | Freq: Once | INTRAMUSCULAR | Status: AC | PRN
  Administered 2021-11-18: 80 mL via INTRAVENOUS

## 2021-11-18 MED ORDER — METRONIDAZOLE 500 MG/100ML IV SOLN
500.0000 mg | Freq: Once | INTRAVENOUS | Status: AC
Start: 2021-11-18 — End: 2021-11-18
  Administered 2021-11-18: 500 mg via INTRAVENOUS
  Filled 2021-11-18: qty 100

## 2021-11-18 MED ORDER — LACTATED RINGERS IV BOLUS (SEPSIS)
250.0000 mL | Freq: Once | INTRAVENOUS | Status: AC
  Administered 2021-11-18: 250 mL via INTRAVENOUS

## 2021-11-18 MED ORDER — LACTATED RINGERS IV BOLUS (SEPSIS)
1000.0000 mL | Freq: Once | INTRAVENOUS | Status: AC
  Administered 2021-11-18: 1000 mL via INTRAVENOUS

## 2021-11-18 MED ORDER — LACTATED RINGERS IV SOLN: INTRAVENOUS | Status: DC

## 2021-11-18 MED ORDER — SODIUM CHLORIDE 0.9 % IV SOLN
2.0000 g | Freq: Once | INTRAVENOUS | Status: DC
  Administered 2021-11-18: 2 g via INTRAVENOUS
  Filled 2021-11-18: qty 12.5

## 2021-11-18 MED ORDER — IBUPROFEN 400 MG PO TABS
600.0000 mg | ORAL_TABLET | Freq: Once | ORAL | Status: AC
  Administered 2021-11-18: 600 mg via ORAL
  Filled 2021-11-18: qty 1

## 2021-11-18 MED ORDER — LACTATED RINGERS IV BOLUS (SEPSIS)
500.0000 mL | Freq: Once | INTRAVENOUS | Status: AC
  Administered 2021-11-18: 500 mL via INTRAVENOUS

## 2021-11-18 MED ORDER — SODIUM CHLORIDE 0.9 % IV SOLN
2.0000 g | Freq: Three times a day (TID) | INTRAVENOUS | Status: DC
  Administered 2021-11-19 (×2): 2 g via INTRAVENOUS
  Filled 2021-11-18 (×2): qty 12.5

## 2021-11-18 MED ORDER — VANCOMYCIN HCL IN DEXTROSE 1-5 GM/200ML-% IV SOLN
1000.0000 mg | Freq: Once | INTRAVENOUS | Status: DC
  Administered 2021-11-18: 1000 mg via INTRAVENOUS
  Filled 2021-11-18: qty 200

## 2021-11-18 MED ORDER — VANCOMYCIN HCL IN DEXTROSE 1-5 GM/200ML-% IV SOLN: 1000.0000 mg | INTRAVENOUS | Status: DC

## 2021-11-18 MED ORDER — VANCOMYCIN HCL 1250 MG/250ML IV SOLN
1250.0000 mg | Freq: Once | INTRAVENOUS | Status: DC
  Filled 2021-11-18: qty 250

## 2021-11-18 MED ORDER — SODIUM CHLORIDE 0.9 % IV SOLN: 2.0000 g | Freq: Two times a day (BID) | INTRAVENOUS | Status: DC

## 2021-11-18 NOTE — ED Provider Notes (Signed)
[place] EMERGENCY DEPT Provider Note   CSN: [id number] Arrival date & time: 11/18/21  1454     History  Chief Complaint  Patient presents with   Fever    Melinda White is a 51 y.o. female.  Presents emerged apartment concern for fever.  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  Had his last chemo treatment 3 days ago.  She reports that earlier today she had low-grade temperature up to 100 and then this afternoon had temp of 103.  Had taken dose of amoxicillin this morning.  She also today has been having some right upper quadrant abdominal pain, no associated nausea or vomiting, pain is currently mild to moderate, does not want any medicine for her pain.  Additional history obtained from review of patient's chart through care everywhere, [facility] oncology notes, 6/13 received - gemcitabine (855m/m2), oxaliplatin (671mm2) + HAIP flush  HPI     Home Medications Prior to Admission medications   Medication Sig Start Date End Date Taking? Authorizing Provider  Cholecalciferol 50 MCG (2000 UT) CAPS Take 2,000 Units by mouth in the morning.    [provider]  ciprofloxacin (CIPRO) 500 MG tablet Take 500 mg by mouth 2 (two) times daily. 09/26/21   [provider]  dexamethasone (DECADRON) 4 MG tablet Take 2 tablets by mouth on days 2, 3 after chemo in the morning 10/21/20   [provider]  fexofenadine (ALLEGRA) 180 MG tablet Take 90 mg by mouth as needed.    [provider]  lidocaine-prilocaine (EMLA) cream Chemo days 12/27/20   [provider]  OLANZapine (ZYPREXA) 5 MG tablet Take 5 mg by mouth at bedtime.    [provider]  ondansetron (ZOFRAN-ODT) 8 MG disintegrating tablet Take 8 mg by mouth every 8 (eight) hours. On chemo days 04/15/21   [provider]  pantoprazole (PROTONIX) 40 MG tablet Take 40 mg by mouth daily. 09/14/21   [provider]   polyethylene glycol powder (GLYCOLAX/MIRALAX) 17 GM/SCOOP powder Take 17 g by mouth 2 (two) times daily. 09/27/20   [provider]  senna (SENOKOT) 8.6 MG tablet Take 1 tablet by mouth 2 (two) times daily. 09/27/20   [provider]  sucralfate (CARAFATE) 1 g tablet Take 1 g by mouth 4 (four) times daily. 08/22/21   [provider]      Allergies    Reglan [metoclopramide] and Penicillins    Review of Systems   Review of Systems  Constitutional:  Positive for fever. Negative for chills.  HENT:  Negative for ear pain and sore throat.   Eyes:  Negative for pain and visual disturbance.  Respiratory:  Negative for cough and shortness of breath.   Cardiovascular:  Negative for chest pain and palpitations.  Gastrointestinal:  Positive for abdominal pain. Negative for vomiting.  Genitourinary:  Negative for dysuria and hematuria.  Musculoskeletal:  Negative for arthralgias and back pain.  Skin:  Negative for color change and rash.  Neurological:  Negative for seizures and syncope.  All other systems reviewed and are negative.   Physical Exam Updated Vital Signs BP 101/66   Pulse 73   Temp 98.5 F (36.9 C)   Resp 16   Ht _0  (1.676 m)   Wt 55.3 kg   SpO2 98%   BMI 19.69 kg/m  Physical Exam Vitals and nursing note reviewed.  Constitutional:      General: She is not in acute distress.  Appearance: She is well-developed.  HENT:     Head: Normocephalic and atraumatic.  Eyes:     Conjunctiva/sclera: Conjunctivae normal.  Cardiovascular:     Rate and Rhythm: Normal rate and regular rhythm.     Heart sounds: No murmur heard. Pulmonary:     Effort: Pulmonary effort is normal. No respiratory distress.     Breath sounds: Normal breath sounds.  Abdominal:     Palpations: Abdomen is soft.     Tenderness: There is abdominal tenderness.     Comments: Generalized tenderness worse in epigastric and right upper quadrants, no rebound or guarding   Musculoskeletal:        General: No swelling.     Cervical back: Neck supple.  Skin:    General: Skin is warm and dry.     Capillary Refill: Capillary refill takes less than 2 seconds.  Neurological:     Mental Status: She is alert.  Psychiatric:        Mood and Affect: Mood normal.     ED Results / Procedures / Treatments   Labs (all labs ordered are listed, but only abnormal results are displayed) Labs Reviewed  COMPREHENSIVE METABOLIC PANEL - Abnormal; Notable for the following components:      Result Value   Glucose, Bld 112 (*)    AST 77 (*)    ALT 98 (*)    Alkaline Phosphatase 172 (*)    All other components within normal limits  CBC WITH DIFFERENTIAL/PLATELET - Abnormal; Notable for the following components:   RBC 3.81 (*)    Hemoglobin 10.3 (*)    HCT 32.6 (*)    Lymphs Abs 0.6 (*)    All other components within normal limits  URINALYSIS, ROUTINE W REFLEX MICROSCOPIC - Abnormal; Notable for the following components:   Protein, ur TRACE (*)    Leukocytes,Ua SMALL (*)    All other components within normal limits  MRSA NEXT GEN BY PCR, NASAL  CULTURE, BLOOD (ROUTINE X 2)  CULTURE, BLOOD (ROUTINE X 2) W REFLEX TO ID PANEL  LACTIC ACID, PLASMA  LACTIC ACID, PLASMA  PROTIME-INR  LIPASE, BLOOD  PREGNANCY, URINE  BASIC METABOLIC PANEL    EKG EKG Interpretation  Date/Time:  Friday November 18 2021 15:22:32 EDT Ventricular Rate:  134 PR Interval:  116 QRS Duration: 70 QT Interval:  290 QTC Calculation: 433 R Axis:   130 Text Interpretation: Sinus tachycardia Low voltage QRS Left posterior fascicular block Abnormal ECG No previous ECGs available Confirmed by Madalyn Rob [phone]) on 11/18/2021 11:19:49 PM  Radiology CT ABDOMEN PELVIS W CONTRAST  Result Date: 11/18/2021 CLINICAL DATA:  Abdominal pain, acute, nonlocalized hx cholangiocarcinoma, concern for abscess, infection, now with fever abd pain EXAM: CT ABDOMEN AND PELVIS WITH CONTRAST TECHNIQUE:  Multidetector CT imaging of the abdomen and pelvis was performed using the standard protocol following bolus administration of intravenous contrast. RADIATION DOSE REDUCTION: This exam was performed according to the departmental dose-optimization program which includes automated exposure control, adjustment of the mA and/or kV according to patient size and/or use of iterative reconstruction technique. CONTRAST:  78mL OMNIPAQUE IOHEXOL 300 MG/ML  SOLN COMPARISON:  10/14/2021 FINDINGS: Lower chest: No acute abnormality. Hepatobiliary: Infiltrative hypoenhancing mass is seen within the porta hepatis compatible with the patient's known cholangiocarcinoma and is grossly stable in size measuring roughly 3.4 x 4.1 cm at axial image # 13/2. 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 There is dual  internal biliary stents in place, unchanged from prior examination with their proximal tips terminating within the region of the mass in segment segment 4 and just distal to the mass in segment 8. There is mild intrahepatic biliary ductal dilation within segments 6, 7, and 2. This appears stable when compared to prior examination. The portal vein is patent. No new intrahepatic masses are identified. Gallbladder absent. Pancreas: Unremarkable Spleen: Unremarkable Adrenals/Urinary Tract: The adrenal glands are unremarkable. Simple cortical cyst noted within the lower pole of the left kidney, unchanged from prior examination. No further follow-up is recommended for this lesion. The kidneys are otherwise unremarkable. The bladder is unremarkable. Stomach/Bowel: Moderate stool throughout the colon without evidence of obstruction. The stomach, small bowel, and large bowel are otherwise unremarkable. The appendix is not clearly identified and may be absent. No free intraperitoneal gas or fluid. Left lower quadrant anterior abdominal wall chemotherapy pump is  unchanged with its catheter tip terminating within the porta hepatis. Vascular/Lymphatic: No significant vascular findings are present. No enlarged abdominal or pelvic lymph nodes. Reproductive: Uterus and bilateral adnexa are unremarkable. Other: No abdominal wall hernia. Musculoskeletal: No acute bone abnormality. No lytic or blastic bone lesion. IMPRESSION: 1. No acute intra-abdominal pathology identified. No definite radiographic explanation for the patient's reported symptoms. 2. Stable infiltrative mass within the porta hepatis in keeping with the patient's known cholangiocarcinoma. Stable mild intrahepatic biliary ductal dilation. 3. Moderate stool throughout the colon without evidence of obstruction. Electronically Signed   By: Fidela Salisbury M.D.   On: 11/18/2021 17:22   DG Chest 2 View  Result Date: 11/18/2021 CLINICAL DATA:  Concern for sepsis EXAM: CHEST - 2 VIEW COMPARISON:  Chest x-ray dated Oct 14, 2021 FINDINGS: Stable position of right chest wall port. The heart size and mediastinal contours are within normal limits. Both lungs are clear. The visualized skeletal structures are unremarkable. IMPRESSION: No active cardiopulmonary disease. Electronically Signed   By: Yetta Glassman M.D.   On: 11/18/2021 17:15    Procedures Procedures    Medications Ordered in ED Medications  lactated ringers infusion ( Intravenous New Bag/Given 11/18/21 1811)  vancomycin (VANCOCIN) IVPB 1000 mg/200 mL premix (has no administration in time range)  ceFEPIme (MAXIPIME) 2 g in sodium chloride 0.9 % 100 mL IVPB (has no administration in time range)  ibuprofen (ADVIL) tablet 600 mg (600 mg Oral Given 11/18/21 1553)  lactated ringers bolus 1,000 mL (0 mLs Intravenous Stopped 11/18/21 1734)    And  lactated ringers bolus 500 mL (0 mLs Intravenous Stopped 11/18/21 1734)    And  lactated ringers bolus 250 mL (0 mLs Intravenous Stopped 11/18/21 1730)  metroNIDAZOLE (FLAGYL) IVPB 500 mg (0 mg Intravenous Stopped  11/18/21 1740)  iohexol (OMNIPAQUE) 300 MG/ML solution 100 mL (80 mLs Intravenous Contrast Given 11/18/21 1656)    ED Course/ Medical Decision Making/ A&P Clinical Course as of 11/18/21 2329  Fri Nov 18, 2021  1557 Reviewed chart, concern for sepsis, febrile neutropenia, will start broad-spectrum antibiotics, obtain broad work-up including labs, CT [RD]    Clinical Course User Index [RD] Lucrezia Starch, MD                           Medical Decision Making Amount and/or Complexity of Data Reviewed Labs: ordered. Radiology: ordered.  Risk Prescription drug management.   51 year old lady with cholangiocarcinoma presenting to ER for abdominal pain, fever.  On initial vitals patient noted to be febrile, borderline  soft blood pressure.  Concern for possibility of sepsis, neutropenic fever given recent chemo treatment.  Gave broad-spectrum antibiotics, fluid bolus.  Reviewed labs, WBC is normal, no neutropenia no leukocytosis.  Mild elevation in LFTs, her T. bili that was normal, the alk phos is improved from prior.  CT scan without acute findings.  I discussed the case with Dr. Reynaldo Minium on-call for oncology at [facility].  He will accept patient, request patient be transferred to [facility] ER.  After multiple conversations with the [facility] transport team, currently transport team, neither team will be able to transport patient until at the earliest tomorrow afternoon or likely tomorrow evening.  I revisited discussion with Dr. Reynaldo Minium, he is comfortable with patient being admitted to [facility] or [facility].  Request that the primary team touch base with the [facility] oncology team tomorrow.  I have placed consult to hospitalist service for patient to be admitted at [facility] or [facility].  I have updated patient, she is agreeable. She is well-appearing, she has not had recurrent fever, her vitals are now within normal limits.        Final Clinical Impression(s) / ED Diagnoses Final diagnoses:   Fever, unspecified fever cause  Cholangiocarcinoma (HCC)  SIRS (systemic inflammatory response syndrome) ([place])    Rx / DC Orders ED Discharge Orders     None         Lucrezia Starch, MD 11/18/21 2329

## 2021-11-18 NOTE — ED Triage Notes (Signed)
Ca treated at [facility]. Fever chills dizziness Started this AM temp 100-103.1. now 101.6 did not take any OTC. Abdominal pain. Took amoxicillin at home per cancer center

## 2021-11-18 NOTE — Progress Notes (Signed)
PHARMACY ANTIBIOTIC CONSULT NOTE   Melinda White a 51 y.o. female admitted on 11/18/2021 with fever/chills/dizziness with concern for sepsis of unknown source .  Pharmacy has been consulted for vancomycin/cefepime dosing.  Scr 0.82 (10/14/2021), LA 1.1 (10/14/2021), WBC 6.4 (11/18/2021)  11/18/2021: Scr 0.96, WBC 6.4 Vital Signs: Tm 101.6, HR elevated, BP WNL  CrCl cannot be calculated (Patient's most recent lab result is older than the maximum 21 days allowed.).  Plan: START Cefepime 2g IV Q12H Flagyl per MD  GIVE Vancomycin 1,000 mg IV x1 (Wt used: 55.3 kg)  THEN Vancomycin 1,000 mg IV Q24H (Scr used: 0.96, Vd used: 0.72, eAUC: 446) Monitor renal function, clinical status, de-escalation, C/S, levels as indicated   Allergies:  Allergies  Allergen Reactions   Reglan [Metoclopramide] Anaphylaxis   Penicillins Rash    Filed Weights   11/18/21 1519  Weight: 55.3 kg (122 lb)       Latest Ref Rng & Units 11/18/2021    3:38 PM 10/14/2021    3:00 PM 10/10/2021    2:40 PM  CBC  WBC 4.0 - 10.5 K/uL 6.4  13.4  5.2   Hemoglobin 12.0 - 15.0 g/dL 10.3  11.5  11.2   Hematocrit 36.0 - 46.0 % 32.6  35.8  34.4   Platelets 150 - 400 K/uL 197  261  271      Antimicrobials this admission: Vancomycin  11/18/2021>>  cefepime 11/18/2021>> Flagyl 6/16 x1  Microbiology results: 11/18/2021 Bcx: sent 11/18/2021 MRSA PCR: sent   Thank you for allowing pharmacy to be a part of this patient's care.  Adria Dill, PharmD PGY-1 Acute Care Resident  11/18/2021 4:16 PM

## 2021-11-19 ENCOUNTER — Encounter (HOSPITAL_COMMUNITY): Payer: Self-pay | Admitting: Internal Medicine

## 2021-11-19 DIAGNOSIS — Z79899 Other long term (current) drug therapy: Secondary | ICD-10-CM | POA: Diagnosis not present

## 2021-11-19 DIAGNOSIS — R509 Fever, unspecified: Secondary | ICD-10-CM | POA: Diagnosis not present

## 2021-11-19 DIAGNOSIS — B952 Enterococcus as the cause of diseases classified elsewhere: Secondary | ICD-10-CM | POA: Diagnosis present

## 2021-11-19 DIAGNOSIS — K219 Gastro-esophageal reflux disease without esophagitis: Secondary | ICD-10-CM

## 2021-11-19 DIAGNOSIS — C221 Intrahepatic bile duct carcinoma: Secondary | ICD-10-CM | POA: Diagnosis not present

## 2021-11-19 DIAGNOSIS — Z880 Allergy status to penicillin: Secondary | ICD-10-CM | POA: Diagnosis not present

## 2021-11-19 DIAGNOSIS — R6510 Systemic inflammatory response syndrome (SIRS) of non-infectious origin without acute organ dysfunction: Secondary | ICD-10-CM | POA: Diagnosis not present

## 2021-11-19 DIAGNOSIS — D509 Iron deficiency anemia, unspecified: Secondary | ICD-10-CM | POA: Diagnosis present

## 2021-11-19 DIAGNOSIS — Z888 Allergy status to other drugs, medicaments and biological substances status: Secondary | ICD-10-CM | POA: Diagnosis not present

## 2021-11-19 DIAGNOSIS — B966 Bacteroides fragilis [B. fragilis] as the cause of diseases classified elsewhere: Secondary | ICD-10-CM | POA: Diagnosis present

## 2021-11-19 DIAGNOSIS — D508 Other iron deficiency anemias: Secondary | ICD-10-CM | POA: Diagnosis not present

## 2021-11-19 DIAGNOSIS — Z20822 Contact with and (suspected) exposure to covid-19: Secondary | ICD-10-CM | POA: Diagnosis present

## 2021-11-19 LAB — SARS CORONAVIRUS 2 BY RT PCR: SARS Coronavirus 2 by RT PCR: NEGATIVE

## 2021-11-19 LAB — HEPATIC FUNCTION PANEL
ALT: 77 U/L — ABNORMAL HIGH (ref 0–44)
AST: 49 U/L — ABNORMAL HIGH (ref 15–41)
Albumin: 3.1 g/dL — ABNORMAL LOW (ref 3.5–5.0)
Alkaline Phosphatase: 149 U/L — ABNORMAL HIGH (ref 38–126)
Bilirubin, Direct: 0.2 mg/dL (ref 0.0–0.2)
Indirect Bilirubin: 1.1 mg/dL — ABNORMAL HIGH (ref 0.3–0.9)
Total Bilirubin: 1.3 mg/dL — ABNORMAL HIGH (ref 0.3–1.2)
Total Protein: 6.3 g/dL — ABNORMAL LOW (ref 6.5–8.1)

## 2021-11-19 LAB — C-REACTIVE PROTEIN: CRP: 13.4 mg/dL — ABNORMAL HIGH (ref <1.0)

## 2021-11-19 LAB — CBC WITH DIFFERENTIAL/PLATELET
Abs Immature Granulocytes: 0.01 10*3/uL (ref 0.00–0.07)
Basophils Absolute: 0 10*3/uL (ref 0.0–0.1)
Basophils Relative: 0 %
Eosinophils Absolute: 0.1 10*3/uL (ref 0.0–0.5)
Eosinophils Relative: 3 %
HCT: 28.3 % — ABNORMAL LOW (ref 36.0–46.0)
Hemoglobin: 8.7 g/dL — ABNORMAL LOW (ref 12.0–15.0)
Immature Granulocytes: 0 %
Lymphocytes Relative: 30 %
Lymphs Abs: 1 10*3/uL (ref 0.7–4.0)
MCH: 27 pg (ref 26.0–34.0)
MCHC: 30.7 g/dL (ref 30.0–36.0)
MCV: 87.9 fL (ref 80.0–100.0)
Monocytes Absolute: 0.1 10*3/uL (ref 0.1–1.0)
Monocytes Relative: 3 %
Neutro Abs: 2.2 10*3/uL (ref 1.7–7.7)
Neutrophils Relative %: 64 %
Platelets: 194 10*3/uL (ref 150–400)
RBC: 3.22 MIL/uL — ABNORMAL LOW (ref 3.87–5.11)
RDW: 15.3 % (ref 11.5–15.5)
WBC: 3.4 10*3/uL — ABNORMAL LOW (ref 4.0–10.5)
nRBC: 0 % (ref 0.0–0.2)

## 2021-11-19 LAB — BASIC METABOLIC PANEL
Anion gap: 3 — ABNORMAL LOW (ref 5–15)
BUN: 13 mg/dL (ref 6–20)
CO2: 24 mmol/L (ref 22–32)
Calcium: 9 mg/dL (ref 8.9–10.3)
Chloride: 112 mmol/L — ABNORMAL HIGH (ref 98–111)
Creatinine, Ser: 0.64 mg/dL (ref 0.44–1.00)
GFR, Estimated: >60 mL/min (ref >60)
Glucose, Bld: 85 mg/dL (ref 70–99)
Potassium: 4.2 mmol/L (ref 3.5–5.1)
Sodium: 139 mmol/L (ref 135–145)

## 2021-11-19 LAB — MAGNESIUM: Magnesium: 1.8 mg/dL (ref 1.7–2.4)

## 2021-11-19 LAB — HIV ANTIBODY (ROUTINE TESTING W REFLEX): HIV Screen 4th Generation wRfx: NONREACTIVE

## 2021-11-19 LAB — PROCALCITONIN: Procalcitonin: 2.28 ng/mL

## 2021-11-19 MED ORDER — ENOXAPARIN SODIUM 40 MG/0.4ML IJ SOSY
40.0000 mg | PREFILLED_SYRINGE | INTRAMUSCULAR | Status: DC
  Administered 2021-11-19: 40 mg via SUBCUTANEOUS
  Filled 2021-11-19 (×2): qty 0.4

## 2021-11-19 MED ORDER — SODIUM CHLORIDE 0.9% FLUSH
10.0000 mL | INTRAVENOUS | Status: DC | PRN
  Administered 2021-11-20: 10 mL

## 2021-11-19 MED ORDER — SUCRALFATE 1 G PO TABS
1.0000 g | ORAL_TABLET | Freq: Four times a day (QID) | ORAL | Status: DC
  Administered 2021-11-19 – 2021-11-20 (×5): 1 g via ORAL
  Filled 2021-11-19 (×5): qty 1

## 2021-11-19 MED ORDER — POLYETHYLENE GLYCOL 3350 17 G PO PACK
17.0000 g | PACK | Freq: Two times a day (BID) | ORAL | Status: DC
  Filled 2021-11-19: qty 1

## 2021-11-19 MED ORDER — CHLORHEXIDINE GLUCONATE CLOTH 2 % EX PADS
6.0000 | MEDICATED_PAD | Freq: Every day | CUTANEOUS | Status: DC
  Administered 2021-11-19 – 2021-11-20 (×2): 6 via TOPICAL

## 2021-11-19 MED ORDER — ONDANSETRON HCL 4 MG PO TABS: 4.0000 mg | ORAL_TABLET | Freq: Four times a day (QID) | ORAL | Status: DC | PRN

## 2021-11-19 MED ORDER — ACETAMINOPHEN 650 MG RE SUPP: 650.0000 mg | Freq: Four times a day (QID) | RECTAL | Status: DC | PRN

## 2021-11-19 MED ORDER — PANTOPRAZOLE SODIUM 40 MG PO TBEC
40.0000 mg | DELAYED_RELEASE_TABLET | Freq: Every day | ORAL | Status: DC
  Administered 2021-11-19 – 2021-11-20 (×2): 40 mg via ORAL
  Filled 2021-11-19 (×2): qty 1

## 2021-11-19 MED ORDER — SENNA 8.6 MG PO TABS
1.0000 | ORAL_TABLET | Freq: Two times a day (BID) | ORAL | Status: DC
  Administered 2021-11-19 – 2021-11-20 (×3): 8.6 mg via ORAL
  Filled 2021-11-19 (×3): qty 1

## 2021-11-19 MED ORDER — ONDANSETRON HCL 4 MG/2ML IJ SOLN: 4.0000 mg | Freq: Four times a day (QID) | INTRAMUSCULAR | Status: DC | PRN

## 2021-11-19 MED ORDER — MORPHINE SULFATE (PF) 4 MG/ML IV SOLN: 4.0000 mg | INTRAVENOUS | Status: DC | PRN

## 2021-11-19 MED ORDER — OLANZAPINE 5 MG PO TABS
5.0000 mg | ORAL_TABLET | Freq: Every day | ORAL | Status: AC
Start: 2021-11-19 — End: 2021-11-19
  Administered 2021-11-19: 5 mg via ORAL
  Filled 2021-11-19: qty 1

## 2021-11-19 MED ORDER — OXYCODONE-ACETAMINOPHEN 5-325 MG PO TABS: 1.0000 | ORAL_TABLET | ORAL | Status: DC | PRN

## 2021-11-19 MED ORDER — PIPERACILLIN-TAZOBACTAM 3.375 G IVPB
3.3750 g | Freq: Three times a day (TID) | INTRAVENOUS | Status: DC
  Administered 2021-11-19 – 2021-11-20 (×3): 3.375 g via INTRAVENOUS
  Filled 2021-11-19 (×2): qty 50

## 2021-11-19 MED ORDER — LACTATED RINGERS IV SOLN: INTRAVENOUS | Status: AC

## 2021-11-19 MED ORDER — METRONIDAZOLE 500 MG/100ML IV SOLN
500.0000 mg | Freq: Two times a day (BID) | INTRAVENOUS | Status: DC
Start: 2021-11-19 — End: 2021-11-19
  Administered 2021-11-19: 500 mg via INTRAVENOUS
  Filled 2021-11-19: qty 100

## 2021-11-19 MED ORDER — ACETAMINOPHEN 325 MG PO TABS: 650.0000 mg | ORAL_TABLET | Freq: Four times a day (QID) | ORAL | Status: DC | PRN

## 2021-11-19 MED ORDER — BISACODYL 10 MG RE SUPP: 10.0000 mg | Freq: Every day | RECTAL | Status: DC | PRN

## 2021-11-19 MED ORDER — SODIUM CHLORIDE 0.9% FLUSH: 10.0000 mL | Freq: Two times a day (BID) | INTRAVENOUS | Status: DC

## 2021-11-19 NOTE — Assessment & Plan Note (Addendum)
   Patient presenting with malaise and fevers as high as 103 F before arriving at [facility] for evaluation where she was found to exhibit multiple SIRS criteria including fever of 101.6 F and tachycardia  Pt recently hospitalized at [facility] twice in the past 6 weeks for similar presentations.  On the first presentation patient was found to have positive blood cultures for Enterococcus faecium and Bacteroides fragilis.  Cultures were negative the second hospitalization.  During both hospitalizations the patient was treated with intravenous antibiotics empirically and discharged home with a course of oral antibiotics.  No obvious evidence of source of infection on this presentation thus far.  Chest x-ray is unremarkable.  Urinalysis is unremarkable.  CT imaging of the abdomen pelvis does not reveal any definitive source of infection.  Initially on intravenous cefepime and Flagyl  ID consulted with abx changed to augmentin  F/u final blood cultures have been obtained

## 2021-11-19 NOTE — Progress Notes (Signed)
Pharmacy Antibiotic Note  Melinda White is a 51 y.o. 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  She was started on vancomycin, cefepime and flagyl on admission for broad coverage for suspected sepsis.  Pharmacy has been consulted on 6/17 to change abx to zosyn for intra-abdominal infection.  Today, 11/19/2021: - wbc low - scr 0.64 (crcl~76)  Plan: - zosyn 3.375 gm IV q8h (infuse over 4 hrs) - With stable renal function, pharmacy will sign off for abx consult.  Reconsult [place] if need further assistance.  __________________________________  Height: '5\' 6"'$  (167.6 cm) Weight: 56.2 kg (123 lb 14.4 oz) IBW/kg (Calculated) : 59.3  Temp (24hrs), Avg:99.1 F (37.3 C), Min:98.4 F (36.9 C), Max:101.6 F (38.7 C)  Recent Labs  Lab 11/18/21 1538 11/18/21 1655 11/19/21 0305 11/19/21 1024  WBC 6.4  --   --  3.4*  CREATININE 0.96  --  0.64  --   LATICACIDVEN 1.8 1.4  --   --     Estimated Creatinine Clearance: 76.3 mL/min (by C-G formula based on SCr of 0.64 mg/dL).    Allergies  Allergen Reactions   Reglan [Metoclopramide] Anaphylaxis   Penicillins Rash    Childhood reaction - currently tolerates amoxicillin     Thank you for allowing pharmacy to be a part of this patient's care.  Lynelle Doctor 11/19/2021 11:32 AM

## 2021-11-19 NOTE — ED Notes (Signed)
Called [facility] to transport patient to [place] rm# [phone]

## 2021-11-19 NOTE — Progress Notes (Signed)
Bed Placement contacted regarding arrival of patient. Awaiting assignment to provider. Awaiting further orders.

## 2021-11-19 NOTE — Assessment & Plan Note (Signed)
   Continue daily PPI and scheduled Carafate

## 2021-11-19 NOTE — Hospital Course (Signed)
51 year old female with past medical history of intrahepatic cholangiocarcinoma (Dx 09/2020 , follows with [facility] oncology,s/p HAI pump placement 07/2021 receiving Gemcitabine and Oxaliplatin), iron deficiency anemia presenting to [place] emergency department with complaints of fever and abdominal pain. Pt found to be febrile. Pt was admitted for further work up for febrile illness of unclear etiology.

## 2021-11-19 NOTE — Assessment & Plan Note (Addendum)
   No clinical evidence of bleeding  Remains hemodynamically stable  Monitoring hemoglobin and hematocrit with serial CBCs

## 2021-11-19 NOTE — Assessment & Plan Note (Addendum)
   Diagnosed 09/2020  Follows with [facility] oncology  Status post biliary stent placement with last exchange being on 10/04/2021 during recent hospitalization  Status post HAI pump placement, actively receiving infusions of gemcitabine and Oxaliplatin

## 2021-11-19 NOTE — H&P (Addendum)
History and Physical    Patient: Melinda White MRN: [id number] DOA: 11/18/2021  Date of Service: the patient was seen and examined on 11/19/2021  Patient coming from: Home  Chief Complaint:  Chief Complaint  Patient presents with   Fever    HPI:   51 year old female with past medical history of intrahepatic cholangiocarcinoma (Dx 09/2020 , follows with [facility] oncology,s/p HAI pump placement 07/2021 receiving Gemcitabine and Oxaliplatin), iron deficiency anemia presenting to [place] emergency department with complaints of fever and abdominal pain.  Of note, patient has been hospitalized at [facility] from 4/28 until 5/3.  Patient was transferred from ED to ED from [facility] to [facility] due to concerns for possible developing cholangitis.  Blood cultures at [facility] prior to transfer grew out Enterococcus faecium and bacteroides fragilis.  Patient was initially treated with intravenous antibiotics at the time.  ERCP was performed and biliary stent was exchanged on 5/2.  With the guidance of infectious disease was transitioned to oral Augmentin and discharged home on 5/3.  Patient once again presented to a [place] emergency room on 5/12 with abdominal pain and fever.  Patient once again underwent ER to ER transfer.  This time patient was placed on intravenous cefepime, vancomycin and Flagyl which was later transitioned to intravenous Zosyn with guidance of infectious disease.  Cultures remain negative and patient was eventually transitioned to oral Augmentin once again at time of discharge.  Patient reports once again that she is beginning to develop increasing abdominal pain, generalized malaise and fever.  Patient reports a low-grade temperature of 100 F the morning of 6/16 followed by a fever of 103 F in the afternoon.  Due to patient's progressive worsening abdominal pain, fatigue and fevers patient presented to [place] emergency department for evaluation.  Patient denies sick  contacts, recent travel, shortness of breath or cough, dysuria or contact with confirmed COVID-19 infection.  Upon evaluation in the emergency department patient was found to have multiple SIRS criteria including being febrile at 101.6 F and tachycardic with concerns for underlying infectious process/early sepsis.  Patient underwent CT imaging of the abdomen and pelvis which failed to identify a source of infection.  ER provider discussed case with Dr. Reynaldo Minium with Oncology at [facility].  Initial plan was for patient to be transferred to [facility] but unfortunately after finding out that the patient would have to wait until at least the evening of 6/17 to get a bed, Dr. Reynaldo Minium instead stated that it would be okay for patient to be admitted to [facility] for further work-up and treatment.  Hospitalist group was then called and patient was excepted for transfer to [facility] for further evaluation.  Review of Systems: Review of Systems  Constitutional:  Positive for fever.  Gastrointestinal:  Positive for abdominal pain.  All other systems reviewed and are negative.    Past Medical History:  Diagnosis Date   Cancer [facility])     History reviewed. No pertinent surgical history.  Social History:  reports that she has never smoked. She has never used smokeless tobacco. She reports that she does not drink alcohol and does not use drugs.  Allergies  Allergen Reactions   Reglan [Metoclopramide] Anaphylaxis   Penicillins Rash    Family History  Problem Relation Age of Onset   Heart disease Neg Hx     Prior to Admission medications   Medication Sig Start Date End Date Taking? Authorizing Provider  Cholecalciferol 50 MCG (2000 UT) CAPS Take 2,000  Units by mouth in the morning.    [provider]  ciprofloxacin (CIPRO) 500 MG tablet Take 500 mg by mouth 2 (two) times daily. 09/26/21   [provider]  dexamethasone (DECADRON) 4 MG tablet Take 2 tablets by mouth on days 2, 3 after chemo in the morning  10/21/20   [provider]  fexofenadine (ALLEGRA) 180 MG tablet Take 90 mg by mouth as needed.    [provider]  lidocaine-prilocaine (EMLA) cream Chemo days 12/27/20   [provider]  OLANZapine (ZYPREXA) 5 MG tablet Take 5 mg by mouth at bedtime.    [provider]  ondansetron (ZOFRAN-ODT) 8 MG disintegrating tablet Take 8 mg by mouth every 8 (eight) hours. On chemo days 04/15/21   [provider]  pantoprazole (PROTONIX) 40 MG tablet Take 40 mg by mouth daily. 09/14/21   [provider]  polyethylene glycol powder (GLYCOLAX/MIRALAX) 17 GM/SCOOP powder Take 17 g by mouth 2 (two) times daily. 09/27/20   [provider]  senna (SENOKOT) 8.6 MG tablet Take 1 tablet by mouth 2 (two) times daily. 09/27/20   [provider]  sucralfate (CARAFATE) 1 g tablet Take 1 g by mouth 4 (four) times daily. 08/22/21   [provider]    Physical Exam:  Vitals:   11/19/21 0200 11/19/21 0236 11/19/21 0237 11/19/21 0606  BP: 115/75 113/76  130/78  Pulse: 71 79  85  Resp: '20 18  16  '$ Temp:  98.7 F (37.1 C)  98.5 F (36.9 C)  TempSrc:  Oral  Oral  SpO2: 98% 99%  99%  Weight:   56.2 kg   Height:   '5\' 6"'$  (1.676 m)     Constitutional: Awake alert and oriented x3, patient is in mild distress due to abdominal pain.   Skin: no rashes, no lesions, slightly poor skin turgor noted. Eyes: Pupils are equally reactive to light.  No evidence of scleral icterus or conjunctival pallor.  ENMT: Lightly dry mucous membranes noted.  Posterior pharynx clear of any exudate or lesions.   Neck: normal, supple, no masses, no thyromegaly.  No evidence of jugular venous distension.   Respiratory: clear to auscultation bilaterally, no wheezing, no crackles. Normal respiratory effort. No accessory muscle use.  Cardiovascular: Regular rate and rhythm, no murmurs / rubs / gallops. No extremity edema. 2+ pedal pulses. No carotid bruits.  Chest:    Nontender without crepitus or deformity.   Back:   Nontender without crepitus or deformity. Abdomen: Notable epigastric and right upper quadrant tenderness.  Notable mass in the left abdomen abdomen due to known HAI pump.  Otherwise, abdomen is soft.  Positive bowel sounds noted in all quadrants.   Musculoskeletal: No joint deformity upper and lower extremities. Good ROM, no contractures. Normal muscle tone.  Neurologic: CN 2-12 grossly intact. Sensation intact.  Patient moving all 4 extremities spontaneously.  Patient is following all commands.  Patient is responsive to verbal stimuli.   Psychiatric: Patient exhibits normal mood with appropriate affect.  Patient seems to possess insight as to their current situation.    Data Reviewed:  I have personally reviewed and interpreted labs, imaging.  Significant findings are:  Chemistry and hepatic function panel revealing sodium 135, potassium 3.8, BUN 18, creatinine 0.96 with AST of 77 and ALT of 98 with alkaline phosphatase of 172. Lactic acid 1.8. White blood cell count of 6.4, hemoglobin 10.3, hematocrit 32.6, platelet count of 197. Coagulation profile reveals INR 1.0 with PT  of 13.2. Urinalysis unremarkable. Lipase 1.4 and 1.8. Chest x-ray personally reviewed revealing no evidence of acute cardiopulmonary disease. CT imaging of the abdomen and pelvis with contrast revealing no acute intra-abdominal pathology with stable infiltrative mass within the porta hepatis in keeping with patient's known history of cholangiocarcinoma with mild intrahepatic biliary ductal dilatation.  EKG: Personally reviewed.  Rhythm is sinus tachycardia with heart rate of 134 bpm.  No dynamic ST segment changes appreciated.   Assessment and Plan: * SIRS (systemic inflammatory response syndrome) (HCC) Patient presenting with malaise and fevers as high as 103 F before arriving at [facility] for evaluation where she was found to exhibit multiple SIRS criteria  including fever of 101.6 F and tachycardia Is worth noting (see HPI for details) patient has been hospitalized at [facility] twice in the past 6 weeks for similar presentations.  On the first presentation patient was found to have positive blood cultures for Enterococcus faecium and Bacteroides fragilis.  Cultures were negative the second hospitalization.  During both hospitalizations the patient was treated with intravenous antibiotics empirically and discharged home with a course of oral antibiotics. No obvious evidence of source of infection on this presentation thus far.  Chest x-ray is unremarkable.  Urinalysis is unremarkable.  CT imaging of the abdomen pelvis does not reveal any definitive source of infection. Considering patient's active malignancy and immunocompromise state will once again treat patient empirically with intravenous antibiotics.  We will keep patient on intravenous cefepime and Flagyl for now. Considering patient's recurrent presentations with fevers despite courses of antibiotics infection of indwelling hardware (stents, HAI pump) must be considered as well.   Blood cultures have been obtained Obtaining procalcitonin and CRP Obtaining COVID-19 PCR testing We will coordinate neck steps in evaluation and care with both patient's oncology team on day shift as well as likely obtaining infectious disease consultation here.  Gastroenterology consultation may also need to be obtained if recommended by patient's oncology team.  Intrahepatic cholangiocarcinoma [facility]) Diagnosed 09/2020 Follows with [facility] oncology Status post biliary stent placement with last exchange being on 10/04/2021 during recent hospitalization Status post HAI pump placement, actively receiving infusions of gemcitabine and Oxaliplatin ER provider discussed case with Dr. Reynaldo Minium on this presentation who after determining that it would take 36 hours for the patient to get a bed at [facility], stated that it would be okay for the  patient to be hospitalized here for continued work-up and treatment.  He requested that the day hospitalist coordinate with the oncology team later in the morning on 6/17.   Iron deficiency anemia Notable substantial anemia No clinical evidence of bleeding Monitoring hemoglobin and hematocrit with serial CBCs   GERD without esophagitis Continue daily PPI and scheduled Carafate       Code Status:  Full code  code  Family Communication: Husband has been updated on plan of care via phone conversation.  Consults: None currently.  We will coordinate with [facility] oncology on day shift as well as potentially obtain infectious disease and gastroenterology consultation based on their recommendations.  Severity of Illness:  The appropriate patient status for this patient is INPATIENT. Inpatient status is judged to be reasonable and necessary in order to provide the required intensity of service to ensure the patient's safety. The patient's presenting symptoms, physical exam findings, and initial radiographic and laboratory data in the context of their chronic comorbidities is felt to place them at high risk for further clinical deterioration. Furthermore, it is not anticipated that the patient  will be medically stable for discharge from the hospital within 2 midnights of admission.   * I certify that at the point of admission it is my clinical judgment that the patient will require inpatient hospital care spanning beyond 2 midnights from the point of admission due to high intensity of service, high risk for further deterioration and high frequency of surveillance required.*  Author:  Vernelle Emerald MD  11/19/2021 7:29 AM

## 2021-11-19 NOTE — Consult Note (Signed)
[place] for Infectious Disease       Reason for Consult: fever   Referring Physician: Dr. Wyline Copas  Principal Problem:   SIRS (systemic inflammatory response syndrome) (HCC) Active Problems:   Intrahepatic cholangiocarcinoma (HCC)   Iron deficiency anemia   GERD without esophagitis    Chlorhexidine Gluconate Cloth  6 each Topical Daily   enoxaparin (LOVENOX) injection  40 mg Subcutaneous Q24H   OLANZapine  5 mg Oral QHS   pantoprazole  40 mg Oral Daily   polyethylene glycol  17 g Oral BID   senna  1 tablet Oral BID   sodium chloride flush  10-40 mL Intracatheter Q12H   sucralfate  1 g Oral QID    Recommendations: Will use piperacillin/tazobactam with recent Enterococcus and Bacteroides in blood cultures Monitor blood cultures  Assessment: She has recurrent fever and abdominal pain concerning for an intra abdominal source that is improved with empiric antibiotics and previous cultures as above.  Despite multiple blood cultures since though, no new positive growth.   Another potential source could be her port which she has had for about 1 year, though I would expect more positive blood cultures but that could be a consideration for her primary team at [facility].    Antibiotics: Cefepime, flagyl  HPI: Melinda White is a 51 y.o. female with intrahepatic cholangiocarcinoma diagnosed in April 2022 and followed at [facility] who comes in with fever.  She presented to the ED in April with fever and abdominal pain and was transferred to [facility] where she gets her care and discharged on 5/3 then again came to the ED on 5/12 and transferred to [facility] with the same presentation and plan and discharged with amoxicillin/clavulanate for 5 days.  She was instructed to restart amox/clavulanate if she again developed fever.  This week, she started to develop the same abdominal pain and then fever yesterday to 103 and took one dose of amox/clavulanate and came in to the ED.  She was unable to transfer to  [facility] yet so admitted here.    Review of Systems:  Constitutional: positive for fevers and chills Gastrointestinal: positive for abdominal pain, negative for nausea and diarrhea All other systems reviewed and are negative    Past Medical History:  Diagnosis Date   Cancer ([place])     Social History   Tobacco Use   Smoking status: Never   Smokeless tobacco: Never  Vaping Use   Vaping Use: Never used  Substance Use Topics   Alcohol use: Never   Drug use: Never    Family History  Problem Relation Age of Onset   Heart disease Neg Hx     Allergies  Allergen Reactions   Reglan [Metoclopramide] Anaphylaxis   Penicillins Rash    Childhood reaction - currently tolerates amoxicillin    Physical Exam: Constitutional: in no apparent distress  Vitals:   11/19/21 0236 11/19/21 0606  BP: 113/76 130/78  Pulse: 79 85  Resp: 18 16  Temp: 98.7 F (37.1 C) 98.5 F (36.9 C)  SpO2: 99% 99%   EYES: anicteric Respiratory: normal respiratory effort GI: soft   Lab Results  Component Value Date   WBC 6.4 11/18/2021   HGB 10.3 (L) 11/18/2021   HCT 32.6 (L) 11/18/2021   MCV 85.6 11/18/2021   PLT 197 11/18/2021    Lab Results  Component Value Date   CREATININE 0.64 11/19/2021   BUN 13 11/19/2021   NA 139 11/19/2021   K 4.2 11/19/2021  CL 112 (H) 11/19/2021   CO2 24 11/19/2021    Lab Results  Component Value Date   ALT 98 (H) 11/18/2021   AST 77 (H) 11/18/2021   ALKPHOS 172 (H) 11/18/2021     Microbiology: Recent Results (from the past 240 hour(s))  Culture, blood (Routine X 2) w Reflex to ID Panel     Status: None (Preliminary result)   Collection Time: 11/18/21  3:37 PM   Specimen: BLOOD  Result Value Ref Range Status   Specimen Description   Final    BLOOD LEFT ANTECUBITAL Performed at [facility], [address], [place], [place] [address]    Special Requests   Final    BOTTLES DRAWN AEROBIC AND ANAEROBIC Blood Culture adequate  volume Performed at [facility], [address], [place], [place] [address]    Culture   Final    NO GROWTH < 12 HOURS Performed at [facility], [place] [address], [place], [place] [address]    Report Status PENDING  Incomplete  Culture, blood (Routine x 2)     Status: None (Preliminary result)   Collection Time: 11/18/21  3:38 PM   Specimen: BLOOD  Result Value Ref Range Status   Specimen Description   Final    BLOOD RIGHT ANTECUBITAL Performed at [facility], [address] [place], [place] [address]    Special Requests   Final    Blood Culture adequate volume BOTTLES DRAWN AEROBIC AND ANAEROBIC Performed at [facility], [address] [place], [place] [address]    Culture   Final    NO GROWTH < 12 HOURS Performed at [facility], [place] [address] [place], [place] [address]    Report Status PENDING  Incomplete  MRSA Next Gen by PCR, Nasal     Status: None   Collection Time: 11/18/21  4:55 PM   Specimen: Nasal Mucosa; Nasal Swab  Result Value Ref Range Status   MRSA by PCR Next Gen NOT DETECTED NOT DETECTED Final    Comment: (NOTE) The GeneXpert MRSA Assay (FDA approved for NASAL specimens only), is one component of a comprehensive MRSA colonization surveillance program. It is not intended to diagnose MRSA infection nor to guide or monitor treatment for MRSA infections. Test performance is not FDA approved in patients less than 25 years old. Performed at [facility], [place] [address], [place], [place] [address]   SARS Coronavirus 2 by RT PCR (hospital order, performed in [facility] hospital lab) *cepheid single result test* Anterior Nasal Swab     Status: None   Collection Time: 11/19/21  6:10 AM   Specimen: Anterior Nasal Swab  Result Value Ref Range Status   SARS Coronavirus 2 by RT PCR NEGATIVE NEGATIVE Final    Comment: (NOTE) SARS-CoV-2 target nucleic acids are NOT DETECTED.  The  SARS-CoV-2 RNA is generally detectable in upper and lower respiratory specimens during the acute phase of infection. The lowest concentration of SARS-CoV-2 viral copies this assay can detect is 250 copies / mL. A negative result does not preclude SARS-CoV-2 infection and should not be used as the sole basis for treatment or other patient management decisions.  A negative result may occur with improper specimen collection / handling, submission of specimen other than nasopharyngeal swab, presence of viral mutation(s) within the areas targeted by this assay, and inadequate number of viral copies (<250 copies / mL). A negative result must be combined with clinical observations, patient  history, and epidemiological information.  Fact Sheet for Patients:   https://www.patel.info/  Fact Sheet for Healthcare Providers: https://hall.com/  This test is not yet approved or  cleared by the [place] FDA and has been authorized for detection and/or diagnosis of SARS-CoV-2 by FDA under an Emergency Use Authorization (EUA).  This EUA will remain in effect (meaning this test can be used) for the duration of the COVID-19 declaration under Section 564(b)(1) of the Act, 21 U.S.C. section 360bbb-3(b)(1), unless the authorization is terminated or revoked sooner.  Performed at [facility] [place] [address] [place], [place] [address]     Debra Calabretta W Lorrain Rivers, MD [facility] for Infectious Disease [place] [facility] [email or web address] 11/19/2021, 11:19 AM

## 2021-11-19 NOTE — Progress Notes (Signed)
Transferring facility: [facility] Requesting provider: Dr. Roslynn Amble (EDP at [facility]) Reason for transfer: admission for further evaluation and management of sepsis of unclear source.   51 year old female with medical history notable for cholangiocarcinoma undergoing chemotherapy, who presented to [place] ED on 11/18/2021 complaining of 1 day of objective fever.  In the setting of her cholangiocarcinoma, follows with [facility] oncology, with most recent chemotherapy occurring a few days ago.  Patient noted development of objective fever on the morning of 11/18/2021, with temperature max at home reported to be 103 on the afternoon of 11/18/2021, prompting her to present to [facility] ED for further evaluation.  She has mild abdominal discomfort, but otherwise no additional symptoms beyond the aforementioned fever.  Vital signs in the ED were notable for the following: Temperature max 101.6, which subsequently decreased to 98.5 following initiation of IV antibiotics.  Was initially tachycardic (sinus) with heart rates in the low 130s, subsequent proving into the 70s following IV fluids and IV antibiotics; initial blood pressure 93/61, which is improved to 101/66 following the above; respiratory rate 16-20, oxygen saturation 98 to 100% on room air.  Labs were notable for urinalysis which was not consistent with infection.  Initial lactate 1.8, with repeat value trending down to 1.4.  CBC notable for will with cell count 6400 with ANC of 5600.  Blood cultures x2 collected prior to initiation of IV antibiotics.  Imaging notable for chest x-ray showed no evidence of acute cardiopulmonary process.  CT abdomen/pelvis was reportedly unremarkable for acute findings.  Medications administered prior to transfer included the following: Neutropenic fever dosing of cefepime, Flagyl, IV vancomycin.  She also received lactated ringer bolus x 1750 cc's and is now on continuous LR.  [facility] discussed patient's case with Dr.  Reynaldo Minium of [facility] oncology, who is amenable to excepting the patient for transfer.  However, it was noted that transport to [place] would be delayed, occurring, at the earliest, on the evening of 11/19/2021.  Per [facility] EDPs discussions with Dr. Reynaldo Minium , Dr.Strickler amenable to admission to the [facility] health system, without any specific recommendations regarding additional IV antibiotics.  Per my discussions with [facility], the estimated transport time to [facility] will hopefully occur on the morning of 11/19/2021, and overall, occur much sooner than transport availability to [facility].  Subsequently, I accepted this patient for transfer for inpatient admission to a PCU bed at [facility] for further work-up and management of sepsis due to unclear source.       Check [email or web address] for on-call coverage.   Nursing staff, Please call [place] number on Amion as soon as patient's arrival, so appropriate admitting provider can evaluate the pt.     Babs Bertin, DO Hospitalist

## 2021-11-19 NOTE — Progress Notes (Signed)
  Progress Note   Patient: Melinda White QPR:[id number] DOB: 06-23-1970 DOA: 11/18/2021     0 DOS: the patient was seen and examined on 11/19/2021   Brief hospital course: 51 year old female with past medical history of intrahepatic cholangiocarcinoma (Dx 09/2020 , follows with [facility] oncology,s/p HAI pump placement 07/2021 receiving Gemcitabine and Oxaliplatin), iron deficiency anemia presenting to [place] emergency department with complaints of fever and abdominal pain. Pt found to be febrile. Pt was admitted for further work up for febrile illness of unclear etiology.  Assessment and Plan: * SIRS (systemic inflammatory response syndrome) (HCC) Patient presenting with malaise and fevers as high as 103 F before arriving at [facility] for evaluation where she was found to exhibit multiple SIRS criteria including fever of 101.6 F and tachycardia Pt recently hospitalized at [facility] twice in the past 6 weeks for similar presentations.  On the first presentation patient was found to have positive blood cultures for Enterococcus faecium and Bacteroides fragilis.  Cultures were negative the second hospitalization.  During both hospitalizations the patient was treated with intravenous antibiotics empirically and discharged home with a course of oral antibiotics. No obvious evidence of source of infection on this presentation thus far.  Chest x-ray is unremarkable.  Urinalysis is unremarkable.  CT imaging of the abdomen pelvis does not reveal any definitive source of infection. Initially on intravenous cefepime and Flagyl ID consulted with abx changed to augmentin F/u final blood cultures have been obtained  Intrahepatic cholangiocarcinoma ([place]) Diagnosed 09/2020 Follows with [facility] oncology Status post biliary stent placement with last exchange being on 10/04/2021 during recent hospitalization Status post HAI pump placement, actively receiving infusions of gemcitabine and Oxaliplatin  Iron deficiency  anemia No clinical evidence of bleeding Remains hemodynamically stable Monitoring hemoglobin and hematocrit with serial CBCs   GERD without esophagitis Continue daily PPI and scheduled Carafate        Subjective: Some RUQ discomfort, otherwise denies sob, chest pain, rashes  Physical Exam: Vitals:   11/19/21 0236 11/19/21 0237 11/19/21 0606 11/19/21 1343  BP: 113/76  130/78 119/80  Pulse: 79  85 93  Resp: '18  16 20  '$ Temp: 98.7 F (37.1 C)  98.5 F (36.9 C) 98.9 F (37.2 C)  TempSrc: Oral  Oral Oral  SpO2: 99%  99% 100%  Weight:  56.2 kg    Height:  '5\' 6"'$  (1.676 m)     General exam: Awake, laying in bed, in nad Respiratory system: Normal respiratory effort, no wheezing Cardiovascular system: regular rate, s1, s2 Gastrointestinal system: Soft, nondistended, positive BS Central nervous system: CN2-12 grossly intact, strength intact Extremities: Perfused, no clubbing Skin: Normal skin turgor, no notable skin lesions seen Psychiatry: Mood normal // no visual hallucinations   Data Reviewed:  Labs reviewed: Hgb 8.7, WBC 3.4Alk phos 149, AST 49, ALT 77, TB 1.3  Family Communication: Pt in room, family not at bedside  Disposition: Status is: Inpatient Remains inpatient appropriate because: Severity of illness  Planned Discharge Destination: Home     Author: Marylu Lund, MD 11/19/2021 3:01 PM  For on call review [email or web address].

## 2021-11-20 DIAGNOSIS — C221 Intrahepatic bile duct carcinoma: Secondary | ICD-10-CM | POA: Diagnosis not present

## 2021-11-20 DIAGNOSIS — R509 Fever, unspecified: Secondary | ICD-10-CM | POA: Diagnosis not present

## 2021-11-20 DIAGNOSIS — R6510 Systemic inflammatory response syndrome (SIRS) of non-infectious origin without acute organ dysfunction: Secondary | ICD-10-CM | POA: Diagnosis not present

## 2021-11-20 LAB — BASIC METABOLIC PANEL
Anion gap: 7 (ref 5–15)
BUN: 13 mg/dL (ref 6–20)
CO2: 25 mmol/L (ref 22–32)
Calcium: 9.1 mg/dL (ref 8.9–10.3)
Chloride: 105 mmol/L (ref 98–111)
Creatinine, Ser: 0.8 mg/dL (ref 0.44–1.00)
GFR, Estimated: >60 mL/min (ref >60)
Glucose, Bld: 94 mg/dL (ref 70–99)
Potassium: 3.8 mmol/L (ref 3.5–5.1)
Sodium: 137 mmol/L (ref 135–145)

## 2021-11-20 LAB — CBC
HCT: 28.4 % — ABNORMAL LOW (ref 36.0–46.0)
Hemoglobin: 8.9 g/dL — ABNORMAL LOW (ref 12.0–15.0)
MCH: 27.2 pg (ref 26.0–34.0)
MCHC: 31.3 g/dL (ref 30.0–36.0)
MCV: 86.9 fL (ref 80.0–100.0)
Platelets: 211 10*3/uL (ref 150–400)
RBC: 3.27 MIL/uL — ABNORMAL LOW (ref 3.87–5.11)
RDW: 15.2 % (ref 11.5–15.5)
WBC: 2.7 10*3/uL — ABNORMAL LOW (ref 4.0–10.5)
nRBC: 0 % (ref 0.0–0.2)

## 2021-11-20 MED ORDER — HEPARIN SOD (PORK) LOCK FLUSH 100 UNIT/ML IV SOLN
500.0000 [IU] | INTRAVENOUS | Status: AC | PRN
  Administered 2021-11-20: 500 [IU]
  Filled 2021-11-20: qty 5

## 2021-11-20 NOTE — Discharge Summary (Signed)
Physician Discharge Summary   Patient: Melinda White MRN: [id number] DOB: [birth year]  Admit date:     11/18/2021  Discharge date: 11/20/21  Discharge Physician: Marylu Lund   PCP: Garnetta Buddy, MD   Recommendations at discharge:    Follow up with PCP in 1-2 weeks Follow up with [facility] as scheduled  Discharge Diagnoses: Principal Problem:   SIRS (systemic inflammatory response syndrome) ([place]) Active Problems:   Intrahepatic cholangiocarcinoma (HCC)   Iron deficiency anemia   GERD without esophagitis  Resolved Problems:   * No resolved hospital problems. *  Hospital Course: 51 year old female with past medical history of intrahepatic cholangiocarcinoma (Dx 09/2020 , follows with [facility] oncology,s/p HAI pump placement 07/2021 receiving Gemcitabine and Oxaliplatin), iron deficiency anemia presenting to [place] emergency department with complaints of fever and abdominal pain. Pt found to be febrile. Pt was admitted for further work up for febrile illness of unclear etiology.  Assessment and Plan: * SIRS (systemic inflammatory response syndrome) (HCC) Patient presenting with malaise and fevers as high as 103 F before arriving at [facility] for evaluation where she was found to exhibit multiple SIRS criteria including fever of 101.6 F and tachycardia Pt recently hospitalized at [facility] twice in the past 6 weeks for similar presentations.  On the first presentation patient was found to have positive blood cultures for Enterococcus faecium and Bacteroides fragilis.  Cultures were negative the second hospitalization.  During both hospitalizations the patient was treated with intravenous antibiotics empirically and discharged home with a course of oral antibiotics. No obvious evidence of source of infection on this presentation thus far.  Chest x-ray is unremarkable.  Urinalysis is unremarkable.  CT imaging of the abdomen pelvis does not reveal any definitive source of  infection. Initially on intravenous cefepime and Flagyl ID was consulted. Pt was observed and had defervesced for over two days. Blood cultures remained negative ID recommended continuing augmentin x 5 days and follow back up with [facility] this week. Pt cleared for discharge from ID perspective   Intrahepatic cholangiocarcinoma ([place]) Diagnosed 09/2020 Follows with [facility] oncology Status post biliary stent placement with last exchange being on 10/04/2021 during recent hospitalization Status post HAI pump placement, actively receiving infusions of gemcitabine and Oxaliplatin   Iron deficiency anemia No clinical evidence of bleeding Remained hemodynamically stable     GERD without esophagitis Continue daily PPI and scheduled Carafate         Consultants: ID  Procedures performed:   Disposition: Home Diet recommendation:  Regular diet DISCHARGE MEDICATION: Allergies as of 11/20/2021       Reactions   Reglan [metoclopramide] Anaphylaxis   Penicillins Rash   Childhood reaction - currently tolerates amoxicillin        Medication List     TAKE these medications    amoxicillin-clavulanate 875-125 MG tablet Commonly known as: AUGMENTIN Take 1 tablet by mouth 2 (two) times daily.   dexamethasone 4 MG tablet Commonly known as: DECADRON Take 8 mg by mouth See admin instructions. Take 2 tablets (8 mg) by mouth on 2nd and 3rd mornings after chemo (chemo day is day 1)   fexofenadine 180 MG tablet Commonly known as: ALLEGRA Take 90 mg by mouth daily as needed for allergies or rhinitis.   lidocaine-prilocaine cream Commonly known as: EMLA Apply 1 Application topically once as needed (prior to port access).   OLANZapine 5 MG tablet Commonly known as: ZYPREXA Take 5 mg by mouth See admin instructions. Take one tablet (  5 mg) by mouth for 4 nights after chemo treatments   ondansetron 8 MG disintegrating tablet Commonly known as: ZOFRAN-ODT Take 8 mg by mouth every 8 (eight) hours  as needed for nausea or vomiting.   pantoprazole 40 MG tablet Commonly known as: PROTONIX Take 40 mg by mouth every morning.   polyethylene glycol powder 17 GM/SCOOP powder Commonly known as: GLYCOLAX/MIRALAX Take 17 g by mouth every morning.   senna 8.6 MG tablet Commonly known as: SENOKOT Take 1 tablet by mouth 2 (two) times daily.   sucralfate 1 g tablet Commonly known as: CARAFATE Take 1 g by mouth 2 (two) times daily.   ursodiol 300 MG capsule Commonly known as: ACTIGALL Take 300 mg by mouth 2 (two) times daily.        Discharge Exam: Filed Weights   11/18/21 1519 11/19/21 0237  Weight: 55.3 kg 56.2 kg   General exam: Awake, laying in bed, in nad Respiratory system: Normal respiratory effort, no wheezing Cardiovascular system: regular rate, s1, s2 Gastrointestinal system: Soft, nondistended, positive BS Central nervous system: CN2-12 grossly intact, strength intact Extremities: Perfused, no clubbing Skin: Normal skin turgor, no notable skin lesions seen Psychiatry: Mood normal // no visual hallucinations   Condition at discharge: fair  The results of significant diagnostics from this hospitalization (including imaging, microbiology, ancillary and laboratory) are listed below for reference.   Imaging Studies: CT ABDOMEN PELVIS W CONTRAST  Result Date: 11/18/2021 CLINICAL DATA:  Abdominal pain, acute, nonlocalized hx cholangiocarcinoma, concern for abscess, infection, now with fever abd pain EXAM: CT ABDOMEN AND PELVIS WITH CONTRAST TECHNIQUE: Multidetector CT imaging of the abdomen and pelvis was performed using the standard protocol following bolus administration of intravenous contrast. RADIATION DOSE REDUCTION: This exam was performed according to the departmental dose-optimization program which includes automated exposure control, adjustment of the mA and/or kV according to patient size and/or use of iterative reconstruction technique. CONTRAST:  20m OMNIPAQUE  IOHEXOL 300 MG/ML  SOLN COMPARISON:  10/14/2021 FINDINGS: Lower chest: No acute abnormality. Hepatobiliary: Infiltrative hypoenhancing mass is seen within the porta hepatis compatible with the patient's known cholangiocarcinoma and is grossly stable in size measuring roughly 3.4 x 4.1 cm at axial image # 13/2. 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 There is dual internal biliary stents in place, unchanged from prior examination with their proximal tips terminating within the region of the mass in segment segment 4 and just distal to the mass in segment 8. There is mild intrahepatic biliary ductal dilation within segments 6, 7, and 2. This appears stable when compared to prior examination. The portal vein is patent. No new intrahepatic masses are identified. Gallbladder absent. Pancreas: Unremarkable Spleen: Unremarkable Adrenals/Urinary Tract: The adrenal glands are unremarkable. Simple cortical cyst noted within the lower pole of the left kidney, unchanged from prior examination. No further follow-up is recommended for this lesion. The kidneys are otherwise unremarkable. The bladder is unremarkable. Stomach/Bowel: Moderate stool throughout the colon without evidence of obstruction. The stomach, small bowel, and large bowel are otherwise unremarkable. The appendix is not clearly identified and may be absent. No free intraperitoneal gas or fluid. Left lower quadrant anterior abdominal wall chemotherapy pump is unchanged with its catheter tip terminating within the porta hepatis. Vascular/Lymphatic: No significant vascular findings are present. No enlarged abdominal or pelvic lymph nodes. Reproductive: Uterus and bilateral adnexa are unremarkable. Other: No abdominal wall hernia. Musculoskeletal: No acute bone abnormality. No lytic or blastic  bone lesion. IMPRESSION: 1. No acute intra-abdominal pathology identified. No definite radiographic  explanation for the patient's reported symptoms. 2. Stable infiltrative mass within the porta hepatis in keeping with the patient's known cholangiocarcinoma. Stable mild intrahepatic biliary ductal dilation. 3. Moderate stool throughout the colon without evidence of obstruction. Electronically Signed   By: Fidela Salisbury M.D.   On: 11/18/2021 17:22   DG Chest 2 View  Result Date: 11/18/2021 CLINICAL DATA:  Concern for sepsis EXAM: CHEST - 2 VIEW COMPARISON:  Chest x-ray dated Oct 14, 2021 FINDINGS: Stable position of right chest wall port. The heart size and mediastinal contours are within normal limits. Both lungs are clear. The visualized skeletal structures are unremarkable. IMPRESSION: No active cardiopulmonary disease. Electronically Signed   By: Yetta Glassman M.D.   On: 11/18/2021 17:15    Microbiology: Results for orders placed or performed during the hospital encounter of 11/18/21  Culture, blood (Routine X 2) w Reflex to ID Panel     Status: None (Preliminary result)   Collection Time: 11/18/21  3:37 PM   Specimen: BLOOD  Result Value Ref Range Status   Specimen Description   Final    BLOOD LEFT ANTECUBITAL Performed at [facility], [address], [place], [place] [address]    Special Requests   Final    BOTTLES DRAWN AEROBIC AND ANAEROBIC Blood Culture adequate volume Performed at [facility], [address], [place], [place] [address]    Culture   Final    NO GROWTH 2 DAYS Performed at [facility], [place] [address] [place], [place] [address]    Report Status PENDING  Incomplete  Culture, blood (Routine x 2)     Status: None (Preliminary result)   Collection Time: 11/18/21  3:38 PM   Specimen: BLOOD  Result Value Ref Range Status   Specimen Description   Final    BLOOD RIGHT ANTECUBITAL Performed at [facility], [address] [place], [place] [address]    Special Requests   Final    Blood Culture  adequate volume BOTTLES DRAWN AEROBIC AND ANAEROBIC Performed at [facility], [address], [place], [place] [address]    Culture   Final    NO GROWTH 2 DAYS Performed at [facility], [place] [address], [place], [place] [address]    Report Status PENDING  Incomplete  MRSA Next Gen by PCR, Nasal     Status: None   Collection Time: 11/18/21  4:55 PM   Specimen: Nasal Mucosa; Nasal Swab  Result Value Ref Range Status   MRSA by PCR Next Gen NOT DETECTED NOT DETECTED Final    Comment: (NOTE) The GeneXpert MRSA Assay (FDA approved for NASAL specimens only), is one component of a comprehensive MRSA colonization surveillance program. It is not intended to diagnose MRSA infection nor to guide or monitor treatment for MRSA infections. Test performance is not FDA approved in patients less than 22 years old. Performed at [facility], [place] [address] [place], [place] [address]   SARS Coronavirus 2 by RT PCR (hospital order, performed in [facility] hospital lab) *cepheid single result test* Anterior Nasal Swab     Status: None   Collection Time: 11/19/21  6:10 AM   Specimen: Anterior Nasal Swab  Result Value Ref Range Status   SARS Coronavirus 2 by RT PCR NEGATIVE NEGATIVE Final    Comment: (NOTE) SARS-CoV-2 target nucleic acids are NOT DETECTED.  The SARS-CoV-2 RNA  is generally detectable in upper and lower respiratory specimens during the acute phase of infection. The lowest concentration of SARS-CoV-2 viral copies this assay can detect is 250 copies / mL. A negative result does not preclude SARS-CoV-2 infection and should not be used as the sole basis for treatment or other patient management decisions.  A negative result may occur with improper specimen collection / handling, submission of specimen other than nasopharyngeal swab, presence of viral mutation(s) within the areas targeted by this assay, and inadequate number of viral copies (<250 copies /  mL). A negative result must be combined with clinical observations, patient history, and epidemiological information.  Fact Sheet for Patients:   https://www.patel.info/  Fact Sheet for Healthcare Providers: https://hall.com/  This test is not yet approved or  cleared by the [place] FDA and has been authorized for detection and/or diagnosis of SARS-CoV-2 by FDA under an Emergency Use Authorization (EUA).  This EUA will remain in effect (meaning this test can be used) for the duration of the COVID-19 declaration under Section 564(b)(1) of the Act, 21 U.S.C. section 360bbb-3(b)(1), unless the authorization is terminated or revoked sooner.  Performed at [facility], [place] [address], [place], [place] [address]     Labs: CBC: Recent Labs  Lab 11/18/21 1538 11/19/21 1024 11/20/21 0450  WBC 6.4 3.4* 2.7*  NEUTROABS 5.6 2.2  --   HGB 10.3* 8.7* 8.9*  HCT 32.6* 28.3* 28.4*  MCV 85.6 87.9 86.9  PLT 197 194 329   Basic Metabolic Panel: Recent Labs  Lab 11/18/21 1538 11/19/21 0305 11/19/21 1041 11/20/21 0450  NA 135 139  --  137  K 3.8 4.2  --  3.8  CL 101 112*  --  105  CO2 24 24  --  25  GLUCOSE 112* 85  --  94  BUN 18 13  --  13  CREATININE 0.96 0.64  --  0.80  CALCIUM 9.8 9.0  --  9.1  MG  --   --  1.8  --    Liver Function Tests: Recent Labs  Lab 11/18/21 1538 11/19/21 1024  AST 77* 49*  ALT 98* 77*  ALKPHOS 172* 149*  BILITOT 0.9 1.3*  PROT 7.2 6.3*  ALBUMIN 4.2 3.1*   CBG: No results for input(s): "GLUCAP" in the last 168 hours.  Discharge time spent: less than 30 minutes.  Signed: Marylu Lund, MD [facility] 11/20/2021

## 2021-11-20 NOTE — Plan of Care (Signed)
  Problem: Education: Goal: Knowledge of General Education information will improve Description: Including pain rating scale, medication(s)/side effects and non-pharmacologic comfort measures Outcome: Adequate for Discharge   Problem: Health Behavior/Discharge Planning: Goal: Ability to manage health-related needs will improve Outcome: Adequate for Discharge   Problem: Clinical Measurements: Goal: Ability to maintain clinical measurements within normal limits will improve Outcome: Adequate for Discharge Goal: Will remain free from infection Outcome: Adequate for Discharge Goal: Diagnostic test results will improve Outcome: Adequate for Discharge Goal: Cardiovascular complication will be avoided Outcome: Adequate for Discharge   Problem: Nutrition: Goal: Adequate nutrition will be maintained Outcome: Adequate for Discharge   Problem: Coping: Goal: Level of anxiety will decrease Outcome: Adequate for Discharge   Problem: Elimination: Goal: Will not experience complications related to bowel motility Outcome: Adequate for Discharge Goal: Will not experience complications related to urinary retention Outcome: Adequate for Discharge   Problem: Pain Managment: Goal: General experience of comfort will improve Outcome: Adequate for Discharge   Problem: Safety: Goal: Ability to remain free from injury will improve Outcome: Adequate for Discharge   Problem: Skin Integrity: Goal: Risk for impaired skin integrity will decrease Outcome: Adequate for Discharge   Problem: Fluid Volume: Goal: Hemodynamic stability will improve Outcome: Adequate for Discharge   Problem: Clinical Measurements: Goal: Diagnostic test results will improve Outcome: Adequate for Discharge Goal: Signs and symptoms of infection will decrease Outcome: Adequate for Discharge

## 2021-11-20 NOTE — TOC CM/SW Note (Signed)
  Transition of Care [facility]) Screening Note   Patient Details  Name: Melinda White Date of Birth: 05/31/1971   [facility]) CM/SW Contact:    Ross Ludwig, LCSW Phone Number: 11/20/2021, 3:48 PM    Transition of Care Department [facility]) has reviewed patient and no TOC needs have been identified at this time. We will continue to monitor patient advancement through interdisciplinary progression rounds. If new patient transition needs arise, please place a TOC consult.

## 2021-11-20 NOTE — Progress Notes (Signed)
[facility]   Reason for visit: Follow up on fever  Interval History: remains afebrile > 48 hours; WBC 2.7.  some pain with movement at the stent area.    Physical Exam: Constitutional:  Vitals:   11/20/21 0955 11/20/21 0956  BP: 114/87 112/78  Pulse: 98 (!) 102  Resp: 18 18  Temp: 98.3 F (36.8 C) 98.3 F (36.8 C)  SpO2: 100% 98%   patient appears in NAD Respiratory: Normal respiratory effort  Review of Systems: Constitutional: negative for fevers and chills  Lab Results  Component Value Date   WBC 2.7 (L) 11/20/2021   HGB 8.9 (L) 11/20/2021   HCT 28.4 (L) 11/20/2021   MCV 86.9 11/20/2021   PLT 211 11/20/2021    Lab Results  Component Value Date   CREATININE 0.80 11/20/2021   BUN 13 11/20/2021   NA 137 11/20/2021   K 3.8 11/20/2021   CL 105 11/20/2021   CO2 25 11/20/2021    Lab Results  Component Value Date   ALT 77 (H) 11/19/2021   AST 49 (H) 11/19/2021   ALKPHOS 149 (H) 11/19/2021     Microbiology: Recent Results (from the past 240 hour(s))  Culture, blood (Routine X 2) w Reflex to ID Panel     Status: None (Preliminary result)   Collection Time: 11/18/21  3:37 PM   Specimen: BLOOD  Result Value Ref Range Status   Specimen Description   Final    BLOOD LEFT ANTECUBITAL Performed at [facility], [address] [place], [place] [address]    Special Requests   Final    BOTTLES DRAWN AEROBIC AND ANAEROBIC Blood Culture adequate volume Performed at [facility], [address], [place], [place] [address]    Culture   Final    NO GROWTH 2 DAYS Performed at [facility], [place] [address] [place], [place] [address]    Report Status PENDING  Incomplete  Culture, blood (Routine x 2)     Status: None (Preliminary result)   Collection Time: 11/18/21  3:38 PM   Specimen: BLOOD  Result Value Ref Range Status   Specimen Description   Final    BLOOD RIGHT ANTECUBITAL Performed at  [facility], [address] [place], [place] [address]    Special Requests   Final    Blood Culture adequate volume BOTTLES DRAWN AEROBIC AND ANAEROBIC Performed at [facility], [address], [place], [place] [address]    Culture   Final    NO GROWTH 2 DAYS Performed at [facility], [place] [address] [place], [place] [address]    Report Status PENDING  Incomplete  MRSA Next Gen by PCR, Nasal     Status: None   Collection Time: 11/18/21  4:55 PM   Specimen: Nasal Mucosa; Nasal Swab  Result Value Ref Range Status   MRSA by PCR Next Gen NOT DETECTED NOT DETECTED Final    Comment: (NOTE) The GeneXpert MRSA Assay (FDA approved for NASAL specimens only), is one component of a comprehensive MRSA colonization surveillance program. It is not intended to diagnose MRSA infection nor to guide or monitor treatment for MRSA infections. Test performance is not FDA approved in patients less than 59 years old. Performed at [facility], [place] [address], [place], [place] [address]   SARS Coronavirus 2 by RT PCR (hospital order, performed in [facility] hospital lab) *cepheid single result test* Anterior Nasal Swab  Status: None   Collection Time: 11/19/21  6:10 AM   Specimen: Anterior Nasal Swab  Result Value Ref Range Status   SARS Coronavirus 2 by RT PCR NEGATIVE NEGATIVE Final    Comment: (NOTE) SARS-CoV-2 target nucleic acids are NOT DETECTED.  The SARS-CoV-2 RNA is generally detectable in upper and lower respiratory specimens during the acute phase of infection. The lowest concentration of SARS-CoV-2 viral copies this assay can detect is 250 copies / mL. A negative result does not preclude SARS-CoV-2 infection and should not be used as the sole basis for treatment or other patient management decisions.  A negative result may occur with improper specimen collection / handling, submission of specimen other than nasopharyngeal swab,  presence of viral mutation(s) within the areas targeted by this assay, and inadequate number of viral copies (<250 copies / mL). A negative result must be combined with clinical observations, patient history, and epidemiological information.  Fact Sheet for Patients:   https://www.patel.info/  Fact Sheet for Healthcare Providers: https://[place].com/  This test is not yet approved or  cleared by the [place] FDA and has been authorized for detection and/or diagnosis of SARS-CoV-2 by FDA under an Emergency Use Authorization (EUA).  This EUA will remain in effect (meaning this test can be used) for the duration of the COVID-19 declaration under Section 564(b)(1) of the Act, 21 U.S.C. section 360bbb-3(b)(1), unless the authorization is terminated or revoked sooner.  Performed at [facility], [place] [address], [place], [place] [address]     Impression/Plan:  1. Fever - again fever has defervesced now over two days.  No positive cultures and she otherwise is feeling well with no chills and taking adequate po intake.   At this point, I recommend continued amoxicillin/clavulanate ffor 5 days and she should follow up with her oncology team at [facility] this week.    Though her fever seems to be associated with her stent and abdominal pain, can consider a line holiday from her port-a-cath with recent bacteremia as a potential source for her recurrent fevers.  We will though defer to her primary team.   2.  Abdominal pain - seems related to the stent.  She will follow up with her primary team for evaluation.    3.  Disposition - ok from an ID standpoint for discharge and patient is agreeable with discharge today and follow up as above.   I will sign off, call with questions

## 2021-11-23 LAB — CULTURE, BLOOD (ROUTINE X 2)
Culture: NO GROWTH
Culture: NO GROWTH
Special Requests: ADEQUATE
Special Requests: ADEQUATE

## 2022-01-02 ENCOUNTER — Other Ambulatory Visit (HOSPITAL_COMMUNITY)
Admission: RE | Admit: 2022-01-02 | Discharge: 2022-01-02 | Disposition: A | Discharge status: Home / Self Care | Payer: Medicaid Other | Attending: Nurse Practitioner | Admitting: Nurse Practitioner

## 2022-01-02 DIAGNOSIS — C221 Intrahepatic bile duct carcinoma: Secondary | ICD-10-CM | POA: Diagnosis present

## 2022-01-02 LAB — COMPREHENSIVE METABOLIC PANEL
ALT: 78 U/L — ABNORMAL HIGH (ref 0–44)
AST: 49 U/L — ABNORMAL HIGH (ref 15–41)
Albumin: 3.5 g/dL (ref 3.5–5.0)
Alkaline Phosphatase: 338 U/L — ABNORMAL HIGH (ref 38–126)
Anion gap: 7 (ref 5–15)
BUN: 11 mg/dL (ref 6–20)
CO2: 28 mmol/L (ref 22–32)
Calcium: 9.4 mg/dL (ref 8.9–10.3)
Chloride: 105 mmol/L (ref 98–111)
Creatinine, Ser: 0.78 mg/dL (ref 0.44–1.00)
GFR, Estimated: >60 mL/min (ref >60)
Glucose, Bld: 109 mg/dL — ABNORMAL HIGH (ref 70–99)
Potassium: 3.9 mmol/L (ref 3.5–5.1)
Sodium: 140 mmol/L (ref 135–145)
Total Bilirubin: 0.7 mg/dL (ref 0.3–1.2)
Total Protein: 6.8 g/dL (ref 6.5–8.1)

## 2022-01-02 LAB — CBC WITH DIFFERENTIAL/PLATELET
Abs Immature Granulocytes: 0.16 10*3/uL — ABNORMAL HIGH (ref 0.00–0.07)
Basophils Absolute: 0.1 10*3/uL (ref 0.0–0.1)
Basophils Relative: 1 %
Eosinophils Absolute: 0.1 10*3/uL (ref 0.0–0.5)
Eosinophils Relative: 1 %
HCT: 36.6 % (ref 36.0–46.0)
Hemoglobin: 12.1 g/dL (ref 12.0–15.0)
Immature Granulocytes: 1 %
Lymphocytes Relative: 12 %
Lymphs Abs: 1.5 10*3/uL (ref 0.7–4.0)
MCH: 29.8 pg (ref 26.0–34.0)
MCHC: 33.1 g/dL (ref 30.0–36.0)
MCV: 90.1 fL (ref 80.0–100.0)
Monocytes Absolute: 1 10*3/uL (ref 0.1–1.0)
Monocytes Relative: 8 %
Neutro Abs: 9.5 10*3/uL — ABNORMAL HIGH (ref 1.7–7.7)
Neutrophils Relative %: 77 %
Platelets: 70 10*3/uL — ABNORMAL LOW (ref 150–400)
RBC: 4.06 MIL/uL (ref 3.87–5.11)
RDW: 19.9 % — ABNORMAL HIGH (ref 11.5–15.5)
WBC: 12.2 10*3/uL — ABNORMAL HIGH (ref 4.0–10.5)
nRBC: 0 % (ref 0.0–0.2)

## 2022-02-08 ENCOUNTER — Encounter (HOSPITAL_COMMUNITY): Payer: Self-pay

## 2022-02-08 ENCOUNTER — Ambulatory Visit (HOSPITAL_COMMUNITY)
Admission: RE | Admit: 2022-02-08 | Discharge: 2022-02-08 | Disposition: A | Discharge status: Home / Self Care | Payer: Medicaid Other | Source: Ambulatory Visit | Attending: Emergency Medicine | Admitting: Emergency Medicine

## 2022-02-08 VITALS — BP 110/69 | HR 91 | Temp 99.5°F | Resp 17

## 2022-02-08 DIAGNOSIS — R3129 Other microscopic hematuria: Secondary | ICD-10-CM

## 2022-02-08 DIAGNOSIS — B3731 Acute candidiasis of vulva and vagina: Secondary | ICD-10-CM | POA: Insufficient documentation

## 2022-02-08 DIAGNOSIS — Z9189 Other specified personal risk factors, not elsewhere classified: Secondary | ICD-10-CM | POA: Diagnosis not present

## 2022-02-08 LAB — POCT URINALYSIS DIPSTICK, ED / UC
Bilirubin Urine: NEGATIVE
Glucose, UA: NEGATIVE mg/dL
Ketones, ur: NEGATIVE mg/dL
Nitrite: NEGATIVE
Protein, ur: 30 mg/dL — AB
Specific Gravity, Urine: >=1.03 (ref 1.005–1.030)
Urobilinogen, UA: 0.2 mg/dL (ref 0.0–1.0)
pH: 5.5 (ref 5.0–8.0)

## 2022-02-08 MED ORDER — TERCONAZOLE 0.4 % VA CREA: TOPICAL_CREAM | VAGINAL | 0 refills | Status: DC

## 2022-02-08 NOTE — Discharge Instructions (Addendum)
Based on the symptoms that you described to me today as well as findings on urinalysis I believe that you currently experiencing a vaginal yeast infection that is likely chronic due to your currently undergoing chemotherapy.  I have sent a prescription for terconazole vaginal cream that I would like for you to insert nightly for the next 5 nights.  This medication is not covered by Medicaid so I provided you with a GoodRx coupon at [employer].  Your urinalysis today revealed small amount of protein, trace red blood cells and some white blood cells all of which are consistent with a vaginal yeast infection.  Because you are currently undergoing chemotherapy, and at high risk for infection, we will perform a urine culture which takes 3 to 5 days.  If your urine culture is positive, you will be contacted by phone and antibiotics will be prescribed for you.  If you continue to have blood in your urine for the next 3 to 5 days, particularly if you are having increased amounts of blood in your urine, please reach out to your oncologist to discuss this.  At this time the amount of blood in your urine is not significant and not concerning for abnormal bleeding.  You for visiting urgent care today.

## 2022-02-08 NOTE — ED Provider Notes (Signed)
[facility]    CSN: [id number] Arrival date & time: 02/08/22  1656    HISTORY   Chief Complaint  Patient presents with   Hematuria   HPI Catalea Labrecque is a pleasant, 51 y.o. female who presents to urgent care today. Patient reports noticing blood in her urine and feels a stinging sensation after she urinates.  Patient states is not currently menstruating.  Patient also reports having had an infusion of chemotherapy yesterday that symptoms started shortly after infusion.  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  Patient also reports a history of vaginal candidiasis secondary to chemotherapy.  Due to LFTs being elevated from chemotherapy, she has not tolerated Diflucan well in the past.  The history is provided by the patient.   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 No pertinent surgical history. OB History   No obstetric history on file.    Home Medications    Prior to Admission medications   Medication Sig Start Date End Date Taking? Authorizing Provider  amoxicillin-clavulanate (AUGMENTIN) 875-125 MG tablet Take 1 tablet by mouth 2 (two) times daily. 11/02/21   [provider]  dexamethasone (DECADRON) 4 MG tablet Take 8 mg by mouth See admin instructions. Take 2 tablets (8 mg) by mouth on 2nd and 3rd mornings after chemo (chemo day is day 1) 10/21/20   [provider]  fexofenadine (ALLEGRA) 180 MG tablet Take 90 mg by mouth daily as needed for allergies or  rhinitis.    [provider]  lidocaine-prilocaine (EMLA) cream Apply 1 Application topically once as needed (prior to port access). 12/27/20   [provider]  OLANZapine (ZYPREXA) 5 MG tablet Take 5 mg by mouth See admin instructions. Take one tablet (5 mg) by mouth for 4 nights after chemo treatments    [provider]  ondansetron (ZOFRAN-ODT) 8 MG disintegrating tablet Take 8 mg by mouth every 8 (eight) hours as needed for nausea or vomiting. 04/15/21   [provider]  pantoprazole (PROTONIX) 40 MG tablet Take 40 mg by mouth every morning. 09/14/21   [provider]  polyethylene glycol powder (GLYCOLAX/MIRALAX) 17 GM/SCOOP powder Take 17 g by mouth every morning. 09/27/20   [provider]  senna (SENOKOT) 8.6 MG tablet Take 1 tablet by mouth 2 (two) times daily. 09/27/20   [provider]  sucralfate (CARAFATE) 1 g tablet Take 1 g by mouth 2 (two) times daily. 08/22/21   [provider]  ursodiol (ACTIGALL) 300 MG capsule Take 300 mg by mouth 2 (two) times daily. 11/14/21   [provider]    Family History Family History  Problem Relation Age of Onset   Heart disease Neg Hx    Social History Social History   Tobacco Use   Smoking status: Never   Smokeless tobacco: Never  Vaping Use   Vaping Use: Never used  Substance Use Topics   Alcohol use: Never   Drug use: Never   Allergies   Reglan [metoclopramide] and Penicillins  Review of Systems Review of Systems Pertinent  findings revealed after performing a 14 point review of systems has been noted in the history of present illness.  Physical Exam Triage Vital Signs ED Triage Vitals  Enc Vitals Group     BP 04/01/21 0827 (!) 147/82     Pulse Rate 04/01/21 0827 72     Resp 04/01/21 0827 18     Temp 04/01/21 0827 98.3 F (36.8 C)     Temp Source 04/01/21 0827 Oral     SpO2 04/01/21 0827 98 %     Weight --      Height --      Head Circumference  --      Peak Flow --      Pain Score 04/01/21 0826 5     Pain Loc --      Pain Edu? --      Excl. in [place]? --   No data found.  Updated Vital Signs BP 110/69 (BP Location: Right Arm)   Pulse 91   Temp 99.5 F (37.5 C) (Oral)   Resp 17   SpO2 94%   Physical Exam Vitals and nursing note reviewed.  Constitutional:      General: She is not in acute distress.    Appearance: Normal appearance. She is not ill-appearing.  HENT:     Head: Normocephalic and atraumatic.  Eyes:     General: Lids are normal.        Right eye: No discharge.        Left eye: No discharge.     Extraocular Movements: Extraocular movements intact.     Conjunctiva/sclera: Conjunctivae normal.     Right eye: Right conjunctiva is not injected.     Left eye: Left conjunctiva is not injected.  Neck:     Trachea: Trachea and phonation normal.  Cardiovascular:     Rate and Rhythm: Regular rhythm.     Pulses: Normal pulses.     Heart sounds: Normal heart sounds. No murmur heard.    No friction rub. No gallop.  Pulmonary:     Effort: Pulmonary effort is normal. No accessory muscle usage, prolonged expiration or respiratory distress.     Breath sounds: Normal breath sounds. No stridor, decreased air movement or transmitted upper airway sounds. No decreased breath sounds, wheezing, rhonchi or rales.  Chest:     Chest wall: No tenderness.  Abdominal:     General: Abdomen is flat. Bowel sounds are normal. There is no distension.     Palpations: Abdomen is soft.     Tenderness: There is no abdominal tenderness. There is no right CVA tenderness or left CVA tenderness.     Hernia: No hernia is present.  Musculoskeletal:        General: Normal range of motion.     Cervical back: Normal range of motion and neck supple. Normal range of motion.  Lymphadenopathy:     Cervical: No cervical adenopathy.  Skin:    General: Skin is warm and dry.     Findings: No erythema or rash.  Neurological:     General: No focal  deficit present.     Mental Status: She is alert and oriented to person, place, and time.  Psychiatric:        Mood and Affect: Mood normal.        Behavior: Behavior normal.     Visual Acuity Right Eye Distance:   Left Eye Distance:   Bilateral Distance:    Right Eye Near:   Left  Eye Near:    Bilateral Near:     UC Couse / Diagnostics / Procedures:     Radiology No results found.  Procedures Procedures (including critical care time) EKG  Pending results:  Labs Reviewed  POCT URINALYSIS DIPSTICK, ED / UC - Abnormal; Notable for the following components:      Result Value   Hgb urine dipstick TRACE (*)    Protein, ur 30 (*)    Leukocytes,Ua TRACE (*)    All other components within normal limits  URINE CULTURE    Medications Ordered in UC: Medications - No data to display  UC Diagnoses / Final Clinical Impressions(s)   I have reviewed the triage vital signs and the nursing notes.  Pertinent labs & imaging results that were available during my care of the patient were reviewed by me and considered in my medical decision making (see chart for details).    Final diagnoses:  Other microscopic hematuria  At risk for infection due to chemotherapy  Vaginitis due to Candida   Patient is afebrile at this time.  Patient politely declines STD screening.  Patient will be treated empirically for presumed vaginal candidiasis based on history and results of urinalysis today.  Because patient is at high risk for infection due to undergoing chemotherapy at this time, we will send urine for culture to make sure she is not experiencing a urinary tract infection, I believe this is unlikely given patient's description of symptoms on arrival.  Return precautions advised.  Patient advised to reach out to oncology if she continues to have blood in her urine for the next 3 to 5 days or if she notices the amount of blood in her urine is gradually increasing.  ED Prescriptions      Medication Sig Dispense Auth. Provider   terconazole (TERAZOL 7) 0.4 % vaginal cream Apply twice daily to vulvovaginal area, can reapply after every void, use for 7 days as needed 45 g Lynden Oxford Scales, PA-C      PDMP not reviewed this encounter.  Disposition Upon Discharge:  Condition: stable for discharge home  Patient presented with concern for an acute illness with associated systemic symptoms and significant discomfort requiring urgent management. In my opinion, this is a condition that a prudent lay person (someone who possesses an average knowledge of health and medicine) may potentially expect to result in complications if not addressed urgently such as respiratory distress, impairment of bodily function or dysfunction of bodily organs.   As such, the patient has been evaluated and assessed, work-up was performed and treatment was provided in alignment with urgent care protocols and evidence based medicine.  Patient/parent/caregiver has been advised that the patient may require follow up for further testing and/or treatment if the symptoms continue in spite of treatment, as clinically indicated and appropriate.  Routine symptom specific, illness specific and/or disease specific instructions were discussed with the patient and/or caregiver at length.  Prevention strategies for avoiding STD exposure were also discussed.  The patient will follow up with their current PCP if and as advised. If the patient does not currently have a PCP we will assist them in obtaining one.   The patient may need specialty follow up if the symptoms continue, in spite of conservative treatment and management, for further workup, evaluation, consultation and treatment as clinically indicated and appropriate.  Patient/parent/caregiver verbalized understanding and agreement of plan as discussed.  All questions were addressed during visit.  Please see discharge instructions below for further  details of  plan.  Discharge Instructions:   Discharge Instructions      Based on the symptoms that you described to me today as well as findings on urinalysis I believe that you currently experiencing a vaginal yeast infection that is likely chronic due to your currently undergoing chemotherapy.  I have sent a prescription for terconazole vaginal cream that I would like for you to insert nightly for the next 5 nights.  This medication is not covered by Medicaid so I provided you with a GoodRx coupon at [employer].  Your urinalysis today revealed small amount of protein, trace red blood cells and some white blood cells all of which are consistent with a vaginal yeast infection.  Because you are currently undergoing chemotherapy, and at high risk for infection, we will perform a urine culture which takes 3 to 5 days.  If your urine culture is positive, you will be contacted by phone and antibiotics will be prescribed for you.  If you continue to have blood in your urine for the next 3 to 5 days, particularly if you are having increased amounts of blood in your urine, please reach out to your oncologist to discuss this.  At this time the amount of blood in your urine is not significant and not concerning for abnormal bleeding.  You for visiting urgent care today.      This office note has been dictated using Museum/gallery curator.  Unfortunately, this method of dictation can sometimes lead to typographical or grammatical errors.  I apologize for your inconvenience in advance if this occurs.  Please do not hesitate to reach out to me if clarification is needed.       Lynden Oxford Scales, PA-C 02/08/22 1826

## 2022-02-08 NOTE — ED Triage Notes (Signed)
Pt reports blood in urine x 2 days. Pt states she feels stinging sensation after she urinates.

## 2022-02-09 LAB — URINE CULTURE: Culture: <10000 — AB

## 2022-08-07 ENCOUNTER — Emergency Department (HOSPITAL_BASED_OUTPATIENT_CLINIC_OR_DEPARTMENT_OTHER): Payer: Medicaid Other | Admitting: Radiology

## 2022-08-07 ENCOUNTER — Emergency Department (HOSPITAL_BASED_OUTPATIENT_CLINIC_OR_DEPARTMENT_OTHER)
Admission: EM | Admit: 2022-08-07 | Discharge: 2022-08-07 | Disposition: A | Discharge status: Home / Self Care | Payer: Medicaid Other | Attending: Emergency Medicine | Admitting: Emergency Medicine

## 2022-08-07 ENCOUNTER — Other Ambulatory Visit: Payer: Self-pay

## 2022-08-07 ENCOUNTER — Other Ambulatory Visit (HOSPITAL_BASED_OUTPATIENT_CLINIC_OR_DEPARTMENT_OTHER): Payer: Self-pay

## 2022-08-07 ENCOUNTER — Encounter (HOSPITAL_BASED_OUTPATIENT_CLINIC_OR_DEPARTMENT_OTHER): Payer: Self-pay | Admitting: Emergency Medicine

## 2022-08-07 DIAGNOSIS — R509 Fever, unspecified: Secondary | ICD-10-CM | POA: Diagnosis present

## 2022-08-07 DIAGNOSIS — J069 Acute upper respiratory infection, unspecified: Secondary | ICD-10-CM | POA: Diagnosis not present

## 2022-08-07 DIAGNOSIS — Z8509 Personal history of malignant neoplasm of other digestive organs: Secondary | ICD-10-CM | POA: Diagnosis not present

## 2022-08-07 DIAGNOSIS — J988 Other specified respiratory disorders: Secondary | ICD-10-CM

## 2022-08-07 LAB — CBC WITH DIFFERENTIAL/PLATELET
Abs Immature Granulocytes: 0.1 10*3/uL — ABNORMAL HIGH (ref 0.00–0.07)
Basophils Absolute: 0 10*3/uL (ref 0.0–0.1)
Basophils Relative: 0 %
Eosinophils Absolute: 0 10*3/uL (ref 0.0–0.5)
Eosinophils Relative: 0 %
HCT: 33.7 % — ABNORMAL LOW (ref 36.0–46.0)
Hemoglobin: 10.9 g/dL — ABNORMAL LOW (ref 12.0–15.0)
Immature Granulocytes: 1 %
Lymphocytes Relative: 7 %
Lymphs Abs: 0.6 10*3/uL — ABNORMAL LOW (ref 0.7–4.0)
MCH: 31.1 pg (ref 26.0–34.0)
MCHC: 32.3 g/dL (ref 30.0–36.0)
MCV: 96.3 fL (ref 80.0–100.0)
Monocytes Absolute: 1.2 10*3/uL — ABNORMAL HIGH (ref 0.1–1.0)
Monocytes Relative: 13 %
Neutro Abs: 7.5 10*3/uL (ref 1.7–7.7)
Neutrophils Relative %: 79 %
Platelets: 96 10*3/uL — ABNORMAL LOW (ref 150–400)
RBC: 3.5 MIL/uL — ABNORMAL LOW (ref 3.87–5.11)
RDW: 16.5 % — ABNORMAL HIGH (ref 11.5–15.5)
WBC: 9.5 10*3/uL (ref 4.0–10.5)
nRBC: 0 % (ref 0.0–0.2)

## 2022-08-07 LAB — URINALYSIS, ROUTINE W REFLEX MICROSCOPIC
Bacteria, UA: NONE SEEN
Bilirubin Urine: NEGATIVE
Glucose, UA: NEGATIVE mg/dL
Ketones, ur: NEGATIVE mg/dL
Nitrite: NEGATIVE
Specific Gravity, Urine: 1.022 (ref 1.005–1.030)
pH: 5.5 (ref 5.0–8.0)

## 2022-08-07 LAB — COMPREHENSIVE METABOLIC PANEL
ALT: 41 U/L (ref 0–44)
AST: 43 U/L — ABNORMAL HIGH (ref 15–41)
Albumin: 3.9 g/dL (ref 3.5–5.0)
Alkaline Phosphatase: 390 U/L — ABNORMAL HIGH (ref 38–126)
Anion gap: 8 (ref 5–15)
BUN: 11 mg/dL (ref 6–20)
CO2: 23 mmol/L (ref 22–32)
Calcium: 9 mg/dL (ref 8.9–10.3)
Chloride: 104 mmol/L (ref 98–111)
Creatinine, Ser: 0.78 mg/dL (ref 0.44–1.00)
GFR, Estimated: >60 mL/min (ref >60)
Glucose, Bld: 120 mg/dL — ABNORMAL HIGH (ref 70–99)
Potassium: 3.8 mmol/L (ref 3.5–5.1)
Sodium: 135 mmol/L (ref 135–145)
Total Bilirubin: 1.4 mg/dL — ABNORMAL HIGH (ref 0.3–1.2)
Total Protein: 6.6 g/dL (ref 6.5–8.1)

## 2022-08-07 LAB — LACTIC ACID, PLASMA: Lactic Acid, Venous: 1 mmol/L (ref 0.5–1.9)

## 2022-08-07 MED ORDER — DOXYCYCLINE HYCLATE 100 MG PO TABS
100.0000 mg | ORAL_TABLET | Freq: Once | ORAL | Status: AC
  Administered 2022-08-07: 100 mg via ORAL
  Filled 2022-08-07: qty 1

## 2022-08-07 MED ORDER — FLUCONAZOLE 200 MG PO TABS
200.0000 mg | ORAL_TABLET | Freq: Every day | ORAL | 0 refills | Status: DC | PRN
  Filled 2022-08-07: qty 2, 2d supply, fill #0

## 2022-08-07 MED ORDER — SODIUM CHLORIDE 0.9 % IV SOLN
1.0000 g | Freq: Once | INTRAVENOUS | Status: AC
  Administered 2022-08-07: 1 g via INTRAVENOUS
  Filled 2022-08-07: qty 10

## 2022-08-07 MED ORDER — DOXYCYCLINE HYCLATE 100 MG PO CAPS
100.0000 mg | ORAL_CAPSULE | Freq: Two times a day (BID) | ORAL | 0 refills | Status: AC
  Filled 2022-08-07: qty 12, 6d supply, fill #0

## 2022-08-07 MED ORDER — ACETAMINOPHEN 500 MG PO TABS: 1000.0000 mg | ORAL_TABLET | Freq: Once | ORAL | Status: DC

## 2022-08-07 NOTE — Discharge Instructions (Signed)
You are diagnosed with a possible pneumonia or upper respiratory infection.  We talked about the possibility this may also be a virus.  I thought it was safest to give you antibiotics for potential pneumonia.  Please continue taking the antibiotics at home.  Follow-up with your primary care doctor or your oncologist.  If you continue having shaking chills, or you begin to develop confusion, altered mental status, persistent vomiting, hypoxia, difficulty breathing, or any other emergency concerns, please return to the ER.  If your blood culture is growing bacteria, you will receive a phone call telling you that you need to return immediately to the emergency department.

## 2022-08-07 NOTE — ED Provider Notes (Signed)
[facility] Provider Note   CSN: [id number] Arrival date & time: 08/07/22  1555     History  Chief Complaint  Patient presents with   URI    Melinda White is a 52 y.o. female with a history of intrahepatic cholangiocarcinoma, iron deficiency anemia, on chemotherapy, presenting to ED with fevers and cough.  Patient reports she has had fevers, chills, and dry cough, as well as hoarse voice and nasal congestion for the past 4 days.  Her oncologist office referred her into the ED today for evaluation.  The patient is on courses of ciprofloxacin and Flagyl just completing 1 today, she reports she gets "frequent infections after my chemo infusions".  HPI     Home Medications Prior to Admission medications   Medication Sig Start Date End Date Taking? Authorizing Provider  doxycycline (VIBRAMYCIN) 100 MG capsule Take 1 capsule (100 mg total) by mouth 2 (two) times daily for 6 days. 08/08/22 08/14/22 Yes Jamez Ambrocio, Carola Rhine, MD  fluconazole (DIFLUCAN) 200 MG tablet Take 1 tablet (200 mg total) by mouth daily as needed for up to 2 doses. Take 1 dose as needed 7 days apart 08/07/22  Yes Medardo Hassing, Carola Rhine, MD  dexamethasone (DECADRON) 4 MG tablet Take 8 mg by mouth See admin instructions. Take 2 tablets (8 mg) by mouth on 2nd and 3rd mornings after chemo (chemo day is day 1) 10/21/20   [provider]  fexofenadine (ALLEGRA) 180 MG tablet Take 90 mg by mouth daily as needed for allergies or rhinitis.    [provider]  lidocaine-prilocaine (EMLA) cream Apply 1 Application topically once as needed (prior to port access). 12/27/20   [provider]  OLANZapine (ZYPREXA) 5 MG tablet Take 5 mg by mouth See admin instructions. Take one tablet (5 mg) by mouth for 4 nights after chemo treatments    [provider]  ondansetron (ZOFRAN-ODT) 8 MG disintegrating tablet Take 8 mg by mouth every 8 (eight) hours as needed for nausea or  vomiting. 04/15/21   [provider]  pantoprazole (PROTONIX) 40 MG tablet Take 40 mg by mouth every morning. 09/14/21   [provider]  polyethylene glycol powder (GLYCOLAX/MIRALAX) 17 GM/SCOOP powder Take 17 g by mouth every morning. 09/27/20   [provider]  senna (SENOKOT) 8.6 MG tablet Take 1 tablet by mouth 2 (two) times daily. 09/27/20   [provider]  sucralfate (CARAFATE) 1 g tablet Take 1 g by mouth 2 (two) times daily. 08/22/21   [provider]  terconazole (TERAZOL 7) 0.4 % vaginal cream Apply twice daily to vulvovaginal area, can reapply after every void, use for 7 days as needed 02/08/22   Lynden Oxford Scales, PA-C  ursodiol (ACTIGALL) 300 MG capsule Take 300 mg by mouth 2 (two) times daily. 11/14/21   [provider]      Allergies    Reglan [metoclopramide] and Penicillins    Review of Systems   Review of Systems  Physical Exam Updated Vital Signs BP 129/76 (BP Location: Left Arm)   Pulse (!) 107   Temp 99.7 F (37.6 C) (Oral)   Resp 15   Ht '5\' 6"'$  (1.676 m)   Wt 54.4 kg   SpO2 96%   BMI 19.37 kg/m  Physical Exam Constitutional:      General: She is not in acute distress. HENT:     Head: Normocephalic and atraumatic.  Eyes:     Conjunctiva/sclera: Conjunctivae normal.  Pupils: Pupils are equal, round, and reactive to light.  Cardiovascular:     Rate and Rhythm: Normal rate and regular rhythm.  Pulmonary:     Effort: Pulmonary effort is normal. No respiratory distress.     Comments: 97% on room air, rhonchi in the lower right lobe Abdominal:     General: There is no distension.     Tenderness: There is no abdominal tenderness.  Skin:    General: Skin is warm and dry.  Neurological:     General: No focal deficit present.     Mental Status: She is alert. Mental status is at baseline.  Psychiatric:        Mood and Affect: Mood normal.        Behavior: Behavior normal.     ED Results /  Procedures / Treatments   Labs (all labs ordered are listed, but only abnormal results are displayed) Labs Reviewed  COMPREHENSIVE METABOLIC PANEL - Abnormal; Notable for the following components:      Result Value   Glucose, Bld 120 (*)    AST 43 (*)    Alkaline Phosphatase 390 (*)    Total Bilirubin 1.4 (*)    All other components within normal limits  CBC WITH DIFFERENTIAL/PLATELET - Abnormal; Notable for the following components:   RBC 3.50 (*)    Hemoglobin 10.9 (*)    HCT 33.7 (*)    RDW 16.5 (*)    Platelets 96 (*)    Lymphs Abs 0.6 (*)    Monocytes Absolute 1.2 (*)    Abs Immature Granulocytes 0.10 (*)    All other components within normal limits  URINALYSIS, ROUTINE W REFLEX MICROSCOPIC - Abnormal; Notable for the following components:   Hgb urine dipstick SMALL (*)    Protein, ur TRACE (*)    Leukocytes,Ua MODERATE (*)    Non Squamous Epithelial 0-5 (*)    All other components within normal limits  CULTURE, BLOOD (SINGLE)  LACTIC ACID, PLASMA    EKG None  Radiology DG Chest 2 View  Result Date: 08/07/2022 CLINICAL DATA:  fever chemo patient EXAM: CHEST - 2 VIEW COMPARISON:  11/18/2021 FINDINGS: Normal heart size and vascularity. Streaky bibasilar atelectasis better appreciated on the lateral view. No definite focal pneumonia, collapse, or consolidation. Negative for edema, effusion, or pneumothorax. Endoscopic biliary stents noted in the right upper quadrant. Trachea midline.  Right IJ power port catheter tip mid SVC level. IMPRESSION: Bibasilar atelectasis.  No other acute process by plain radiography. Electronically Signed   By: Jerilynn Mages.  Shick M.D.   On: 08/07/2022 16:47    Procedures Procedures    Medications Ordered in ED Medications  acetaminophen (TYLENOL) tablet 1,000 mg (has no administration in time range)  cefTRIAXone (ROCEPHIN) 1 g in sodium chloride 0.9 % 100 mL IVPB (0 g Intravenous Stopped 08/07/22 1922)  doxycycline (VIBRA-TABS) tablet 100 mg (100 mg  Oral Given 08/07/22 1827)    ED Course/ Medical Decision Making/ A&P Clinical Course as of 08/07/22 2319  Mon Aug 07, 2022  1816 75% pulse ox which was automatically documented is erroneous.  The patient is not hypoxic.  She is 97% on room air [MT]    Clinical Course User Index [MT] Izzac Rockett, Carola Rhine, MD                             Medical Decision Making Amount and/or Complexity of Data Reviewed Labs: ordered. Radiology: ordered.  Risk OTC drugs. Prescription drug management.   This patient presents to the ED with concern for fever and cough and congestion. This involves an extensive number of treatment options, and is a complaint that carries with it a high risk of complications and morbidity.  The differential diagnosis includes viral respiratory infection versus bacterial infection versus chemo side effects versus other  Co-morbidities that complicate the patient evaluation: Patient is immune O suppressed on chemotherapy  Additional history obtained from patient's husband at bedside  I ordered and personally interpreted labs.  The pertinent results include: White blood cell count 9.5, which is in within normal limits but elevated for this patient's baseline level.  Lactate is normal.  Patient has chronic alk phos elevation, unchanged today.  UA with moderate leukocytes but no nitrites, which may be contaminant, she does not have UTI symptoms  I ordered imaging studies including x-ray of the chest I independently visualized and interpreted imaging which showed bibasilar atelectasis I agree with the radiologist interpretation  The patient was maintained on a cardiac monitor.  I personally viewed and interpreted the cardiac monitored which showed an underlying rhythm of: Sinus rhythm  I ordered medication including IV Rocephin and doxycycline for potential pneumonia.  Tylenol for fever.  I have reviewed the patients home medicines and have made adjustments as needed  Test  Considered: I have low suspicion for bacterial meningitis or acute intra-abdominal infection or surgical emergency.  No indication for LP, neuroimaging or intra-abdominal imaging at this time.   After the interventions noted above, I reevaluated the patient and found that they have: stayed the same   Overall, per my discussion with the patient and her husband, I explained that it is still likely and possible that she is having a viral URI, particularly given her congestion and hoarse voice in addition to her cough.  However, given the potential atelectasis in her lower lungs and x-ray, as well as a rhonchi on exam, as well as some elevation of her baseline white blood cell count, I think it still reasonable to treat for potential pneumonia.  We can give IV antibiotics with Rocephin and sent home on doxycycline.  We did send a blood culture, and we discussed that in the case that her culture was positive, she would need to return to the ER for likely admission and IV antibiotics.  But at this time their preference is to go home and I think this is reasonable.  I have a fairly low suspicion for sepsis.  She is not hypoxic, not tachypneic, does not appear confused or toxic on exam.  Dispostion:  After consideration of the diagnostic results and the patients response to treatment, I feel that the patent would benefit from close outpatient follow-up.         Final Clinical Impression(s) / ED Diagnoses Final diagnoses:  Respiratory infection    Rx / DC Orders ED Discharge Orders          Ordered    doxycycline (VIBRAMYCIN) 100 MG capsule  2 times daily        08/07/22 1814    fluconazole (DIFLUCAN) 200 MG tablet  Daily PRN        08/07/22 1814             Wyvonnia Dusky, MD 08/07/22 2319

## 2022-08-07 NOTE — ED Triage Notes (Signed)
Pt via pov from home with high fever and URI symptoms since Thursday. Pt has had fever of 100-102 consistently, spiking today at 102.5. pt is on chemo for liver cancer; had last treatment 1 week ago. Pt sent by oncologist; she is unable to take tylenol or ibuprofen for fever. Pt alert & oriented, nad noted.

## 2022-08-08 ENCOUNTER — Other Ambulatory Visit (HOSPITAL_BASED_OUTPATIENT_CLINIC_OR_DEPARTMENT_OTHER): Payer: Self-pay

## 2022-08-12 LAB — CULTURE, BLOOD (SINGLE)
Culture: NO GROWTH
Special Requests: ADEQUATE

## 2022-11-01 ENCOUNTER — Other Ambulatory Visit (HOSPITAL_COMMUNITY): Payer: Self-pay

## 2023-05-14 ENCOUNTER — Other Ambulatory Visit: Payer: Self-pay | Admitting: Registered Nurse

## 2023-05-14 DIAGNOSIS — Z1231 Encounter for screening mammogram for malignant neoplasm of breast: Secondary | ICD-10-CM

## 2023-09-03 ENCOUNTER — Encounter (HOSPITAL_COMMUNITY): Payer: Self-pay

## 2023-09-03 ENCOUNTER — Emergency Department (HOSPITAL_COMMUNITY)

## 2023-09-03 ENCOUNTER — Emergency Department (HOSPITAL_COMMUNITY)
Admission: EM | Admit: 2023-09-03 | Discharge: 2023-09-03 | Disposition: A | Discharge status: Home / Self Care | Attending: Emergency Medicine | Admitting: Emergency Medicine

## 2023-09-03 ENCOUNTER — Other Ambulatory Visit: Payer: Self-pay

## 2023-09-03 DIAGNOSIS — R509 Fever, unspecified: Secondary | ICD-10-CM | POA: Diagnosis present

## 2023-09-03 DIAGNOSIS — R5082 Postprocedural fever: Secondary | ICD-10-CM | POA: Insufficient documentation

## 2023-09-03 HISTORY — DX: Malignant neoplasm of liver, not specified as primary or secondary: C22.9

## 2023-09-03 LAB — LIPASE, BLOOD: Lipase: 31 U/L (ref 11–51)

## 2023-09-03 LAB — CBC WITH DIFFERENTIAL/PLATELET
Abs Immature Granulocytes: 0.01 10*3/uL (ref 0.00–0.07)
Basophils Absolute: 0 10*3/uL (ref 0.0–0.1)
Basophils Relative: 0 %
Eosinophils Absolute: 0 10*3/uL (ref 0.0–0.5)
Eosinophils Relative: 1 %
HCT: 36.1 % (ref 36.0–46.0)
Hemoglobin: 11.2 g/dL — ABNORMAL LOW (ref 12.0–15.0)
Immature Granulocytes: 0 %
Lymphocytes Relative: 17 %
Lymphs Abs: 0.7 10*3/uL (ref 0.7–4.0)
MCH: 29.2 pg (ref 26.0–34.0)
MCHC: 31 g/dL (ref 30.0–36.0)
MCV: 94.3 fL (ref 80.0–100.0)
Monocytes Absolute: 0.6 10*3/uL (ref 0.1–1.0)
Monocytes Relative: 15 %
Neutro Abs: 2.6 10*3/uL (ref 1.7–7.7)
Neutrophils Relative %: 67 %
Platelets: 99 10*3/uL — ABNORMAL LOW (ref 150–400)
RBC: 3.83 MIL/uL — ABNORMAL LOW (ref 3.87–5.11)
RDW: 16.3 % — ABNORMAL HIGH (ref 11.5–15.5)
WBC: 3.9 10*3/uL — ABNORMAL LOW (ref 4.0–10.5)
nRBC: 0 % (ref 0.0–0.2)

## 2023-09-03 LAB — URINALYSIS, W/ REFLEX TO CULTURE (INFECTION SUSPECTED)
Bacteria, UA: NONE SEEN
Bilirubin Urine: NEGATIVE
Glucose, UA: NEGATIVE mg/dL
Hgb urine dipstick: NEGATIVE
Ketones, ur: 20 mg/dL — AB
Leukocytes,Ua: NEGATIVE
Nitrite: NEGATIVE
Protein, ur: NEGATIVE mg/dL
Specific Gravity, Urine: 1.015 (ref 1.005–1.030)
pH: 7 (ref 5.0–8.0)

## 2023-09-03 LAB — COMPREHENSIVE METABOLIC PANEL WITH GFR
ALT: 59 U/L — ABNORMAL HIGH (ref 0–44)
AST: 93 U/L — ABNORMAL HIGH (ref 15–41)
Albumin: 3.2 g/dL — ABNORMAL LOW (ref 3.5–5.0)
Alkaline Phosphatase: 194 U/L — ABNORMAL HIGH (ref 38–126)
Anion gap: 7 (ref 5–15)
BUN: 11 mg/dL (ref 6–20)
CO2: 23 mmol/L (ref 22–32)
Calcium: 8.5 mg/dL — ABNORMAL LOW (ref 8.9–10.3)
Chloride: 107 mmol/L (ref 98–111)
Creatinine, Ser: 0.85 mg/dL (ref 0.44–1.00)
GFR, Estimated: >60 mL/min (ref >60)
Glucose, Bld: 95 mg/dL (ref 70–99)
Potassium: 3.6 mmol/L (ref 3.5–5.1)
Sodium: 137 mmol/L (ref 135–145)
Total Bilirubin: 1.6 mg/dL — ABNORMAL HIGH (ref 0.0–1.2)
Total Protein: 6 g/dL — ABNORMAL LOW (ref 6.5–8.1)

## 2023-09-03 LAB — RESP PANEL BY RT-PCR (RSV, FLU A&B, COVID)  RVPGX2
Influenza A by PCR: NEGATIVE
Influenza B by PCR: NEGATIVE
Resp Syncytial Virus by PCR: NEGATIVE
SARS Coronavirus 2 by RT PCR: NEGATIVE

## 2023-09-03 LAB — I-STAT CG4 LACTIC ACID, ED: Lactic Acid, Venous: 0.6 mmol/L (ref 0.5–1.9)

## 2023-09-03 MED ORDER — HEPARIN SOD (PORK) LOCK FLUSH 100 UNIT/ML IV SOLN
500.0000 [IU] | Freq: Once | INTRAVENOUS | Status: AC
  Administered 2023-09-03: 500 [IU]
  Filled 2023-09-03: qty 5

## 2023-09-03 MED ORDER — SODIUM CHLORIDE 0.9 % IV SOLN
2.0000 g | Freq: Once | INTRAVENOUS | Status: AC
  Administered 2023-09-03: 2 g via INTRAVENOUS
  Filled 2023-09-03: qty 12.5

## 2023-09-03 MED ORDER — METRONIDAZOLE 500 MG/100ML IV SOLN
500.0000 mg | Freq: Once | INTRAVENOUS | Status: AC
  Administered 2023-09-03: 500 mg via INTRAVENOUS
  Filled 2023-09-03: qty 100

## 2023-09-03 NOTE — Discharge Instructions (Addendum)
 While you were in the emergency room, you had blood work done that was normal.  We look for signs of infection in your blood work, your chest x-ray, your gallbladder ultrasound, your urine and these were all normal.  You did not have neutropenia.  We drew blood cultures, and gave you a dose of cefepime.  Your liver enzymes were a little bit elevated but this is normal for you.  Your lipase was normal.  Please continue to take your ciprofloxacin.  Tomorrow, please call your oncologist.  Return to the emergency room if you have repeated fever.

## 2023-09-03 NOTE — ED Provider Notes (Signed)
 [facility] EMERGENCY DEPARTMENT AT [facility] Provider Note  CSN: [id number] Arrival date & time: 09/03/23 1716  Chief Complaint(s) Post-op Problem  HPI Melinda White is a 53 y.o. female with a history of cholangiocarcinoma, here today for fever at home.  Patient had biliary duct stent replaced today at [facility].  She follows along with [facility] for her chemotherapy.  Last received chemo 2 weeks ago.  She recorded a fever of 101.2 at home.  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 Authorizing Provider  dexamethasone (DECADRON) 4 MG tablet Take 8 mg by mouth See admin instructions. Take 2 tablets (8 mg) by mouth on 2nd and 3rd mornings after chemo (chemo day is day 1) 10/21/20   [provider]  fexofenadine (ALLEGRA) 180 MG tablet Take 90 mg by mouth daily as needed for allergies or rhinitis.    [provider]  fluconazole (DIFLUCAN) 200 MG tablet Take 1 tablet (200 mg total) by mouth daily as needed for up to 2 doses. Take 1 dose as needed 7 days apart 08/07/22   Terald Sleeper, MD  lidocaine-prilocaine (EMLA) cream Apply 1 Application topically once as needed (prior to port access). 12/27/20   [provider]  OLANZapine (ZYPREXA) 5 MG tablet Take 5 mg by mouth See admin instructions. Take one tablet (5 mg) by mouth for 4 nights after chemo treatments    [provider]  ondansetron (ZOFRAN-ODT) 8 MG disintegrating tablet Take 8 mg by mouth every 8 (eight) hours as needed for nausea or vomiting. 04/15/21   [provider]   pantoprazole (PROTONIX) 40 MG tablet Take 40 mg by mouth every morning. 09/14/21   [provider]  polyethylene glycol powder (GLYCOLAX/MIRALAX) 17 GM/SCOOP powder Take 17 g by mouth every morning. 09/27/20   [provider]  senna (SENOKOT) 8.6 MG tablet Take 1 tablet by mouth 2 (two) times daily. 09/27/20   [provider]  sucralfate (CARAFATE) 1 g tablet Take 1 g by mouth 2 (two) times daily. 08/22/21   [provider]  terconazole (TERAZOL 7) 0.4 % vaginal cream Apply twice daily to vulvovaginal area, can reapply after every void, use for 7 days as needed 02/08/22   Theadora Rama Scales, PA-C  ursodiol (ACTIGALL) 300 MG capsule Take 300 mg by mouth 2 (two) times daily. 11/14/21   [provider]  Past Surgical History History reviewed. No pertinent surgical history. Family History Family History  Problem Relation Age of Onset   Heart disease Neg Hx     Social History Social History   Tobacco Use   Smoking status: Never   Smokeless tobacco: Never  Vaping Use   Vaping status: Never Used  Substance Use Topics   Alcohol use: Never   Drug use: Never   Allergies Reglan [metoclopramide] and Penicillins  Review of Systems Review of Systems  Physical Exam Vital Signs  I have reviewed the triage vital signs BP 100/63   Pulse 75   Temp 99 F (37.2 C)   Resp 16   Ht 5\' 4"  (1.626 m)   Wt 63.5 kg   SpO2 96%   BMI 24.03 kg/m   Physical Exam Vitals reviewed.  Constitutional:      Appearance: She is not toxic-appearing.  HENT:     Head: Normocephalic.  Eyes:     Pupils: Pupils are equal, round, and reactive to light.  Cardiovascular:     Rate and Rhythm: Normal rate.  Pulmonary:     Effort: Pulmonary effort is normal.  Abdominal:     General: Abdomen is flat. There is no distension.     Palpations:  Abdomen is soft.     Tenderness: There is no abdominal tenderness. There is no guarding.  Musculoskeletal:        General: Normal range of motion.  Neurological:     General: No focal deficit present.     Mental Status: She is alert.     ED Results and Treatments Labs (all labs ordered are listed, but only abnormal results are displayed) Labs Reviewed  COMPREHENSIVE METABOLIC PANEL WITH GFR - Abnormal; Notable for the following components:      Result Value   Calcium 8.5 (*)    Total Protein 6.0 (*)    Albumin 3.2 (*)    AST 93 (*)    ALT 59 (*)    Alkaline Phosphatase 194 (*)    Total Bilirubin 1.6 (*)    All other components within normal limits  CBC WITH DIFFERENTIAL/PLATELET - Abnormal; Notable for the following components:   WBC 3.9 (*)    RBC 3.83 (*)    Hemoglobin 11.2 (*)    RDW 16.3 (*)    Platelets 99 (*)    All other components within normal limits  URINALYSIS, W/ REFLEX TO CULTURE (INFECTION SUSPECTED) - Abnormal; Notable for the following components:   Ketones, ur 20 (*)    All other components within normal limits  RESP PANEL BY RT-PCR (RSV, FLU A&B, COVID)  RVPGX2  CULTURE, BLOOD (ROUTINE X 2)  CULTURE, BLOOD (ROUTINE X 2)  LIPASE, BLOOD  I-STAT CG4 LACTIC ACID, ED                                                                                                                          Radiology [facility] Chest [facility]  1 View Result Date: 09/03/2023 CLINICAL DATA:  Cough. Fever. ERCP today. Nausea and abdominal pain. Chemotherapy for cholangiocarcinoma. EXAM: PORTABLE CHEST 1 VIEW COMPARISON:  08/07/2022 FINDINGS: Right Port-A-Cath tip: SVC. The lungs appear clear. Cardiac and mediastinal contours normal. No blunting of the costophrenic angles. No bony abnormality observed. IMPRESSION: 1. No active cardiopulmonary disease is radiographically apparent. 2. Right Port-A-Cath tip: SVC. Electronically Signed   By: Gaylyn Rong M.D.   On: 09/03/2023 19:44   US Abdomen  Limited RUQ (LIVER/GB) Result Date: 09/03/2023 CLINICAL DATA:  [id number] Pancreatitis [id number].  Cholangiocarcinoma. EXAM: ULTRASOUND ABDOMEN LIMITED RIGHT UPPER QUADRANT COMPARISON:  None Available. FINDINGS: Gallbladder: Status post cholecystectomy. Common bile duct: Diameter: 4 mm. Liver: No focal lesion identified. Heterogeneous hepatic parenchymal echogenicity. Portal vein is patent on color Doppler imaging with normal direction of blood flow towards the liver. Other: None. IMPRESSION: Heterogeneous hepatic parenchymal echogenicity. No distinct mass on ultrasound. Finding may be related to history of cholangiocarcinoma. Electronically Signed   By: Tish Frederickson M.D.   On: 09/03/2023 19:39    Pertinent labs & imaging results that were available during my care of the patient were reviewed by me and considered in my medical decision making (see MDM for details).  Medications Ordered in ED Medications  ceFEPIme (MAXIPIME) 2 g in sodium chloride 0.9 % 100 mL IVPB (0 g Intravenous Stopped 09/03/23 1939)  metroNIDAZOLE (FLAGYL) IVPB 500 mg (0 mg Intravenous Stopped 09/03/23 2046)                                                                                                                                     Procedures Procedures  (including critical care time)  Medical Decision Making / ED Course   This patient presents to the ED for concern of fever, this involves an extensive number of treatment options, and is a complaint that carries with it a high risk of complications and morbidity.    MDM: Patient immunocompromise with fever at home.  Initially treated as neutropenic fever, fortunately patient is not neutropenic.  She took a single dose of Cipro at home.  Patient not having significant abdominal pain.  Reassessment 9:50 PM-patient is not neutropenic.  She has been afebrile here in the emergency room.  Her right upper quadrant ultrasound was normal.  Her LFTs are just a touch bump,  consistent with some with cholangiocarcinoma.  Her lipase is not elevated.  Chest x-ray, per my independent review shows no pneumonia.  Urinalysis does not have infection.  With the patient not being neutropenic, having normal labs and imaging, I believe it is appropriate to discharge patient with close follow-up with her oncology team.  She is going to continue to take ciprofloxacin at home.  Patient was agreeable with this plan.  She will contact her oncology team tomorrow.  Additional history obtained: -Additional history obtained from oncology and GI office notes. -External records from outside source  obtained and reviewed including: Chart review including previous notes, labs, imaging, consultation notes   Lab Tests: -I ordered, reviewed, and interpreted labs.   The pertinent results include:   Labs Reviewed  COMPREHENSIVE METABOLIC PANEL WITH GFR - Abnormal; Notable for the following components:      Result Value   Calcium 8.5 (*)    Total Protein 6.0 (*)    Albumin 3.2 (*)    AST 93 (*)    ALT 59 (*)    Alkaline Phosphatase 194 (*)    Total Bilirubin 1.6 (*)    All other components within normal limits  CBC WITH DIFFERENTIAL/PLATELET - Abnormal; Notable for the following components:   WBC 3.9 (*)    RBC 3.83 (*)    Hemoglobin 11.2 (*)    RDW 16.3 (*)    Platelets 99 (*)    All other components within normal limits  URINALYSIS, W/ REFLEX TO CULTURE (INFECTION SUSPECTED) - Abnormal; Notable for the following components:   Ketones, ur 20 (*)    All other components within normal limits  RESP PANEL BY RT-PCR (RSV, FLU A&B, COVID)  RVPGX2  CULTURE, BLOOD (ROUTINE X 2)  CULTURE, BLOOD (ROUTINE X 2)  LIPASE, BLOOD  I-STAT CG4 LACTIC ACID, ED        Imaging Studies ordered: I ordered imaging studies including chest area, right upper quadrant ultrasound I independently visualized and interpreted imaging. I agree with the radiologist interpretation   Medicines ordered  and prescription drug management: Meds ordered this encounter  Medications   ceFEPIme (MAXIPIME) 2 g in sodium chloride 0.9 % 100 mL IVPB    Antibiotic Indication::   Febrile Neutropenia   metroNIDAZOLE (FLAGYL) IVPB 500 mg    Antibiotic Indication::   Intra-abdominal Infection    -I have reviewed the patients home medicines and have made adjustments as needed   Cardiac Monitoring: The patient was maintained on a cardiac monitor.  I personally viewed and interpreted the cardiac monitored which showed an underlying rhythm of: Normal sinus rhythm  Social Determinants of Health:  Factors impacting patients care include: Multiple medical comorbidities including cholangiocarcinoma, actively being treated with chemotherapy.   Reevaluation: After the interventions noted above, I reevaluated the patient and found that they have :improved  Co morbidities that complicate the patient evaluation  Past Medical History:  Diagnosis Date   Cancer (HCC)    Liver cancer (HCC)       Dispostion: I considered admission for this patient,      Final Clinical Impression(s) / ED Diagnoses Final diagnoses:  Post-procedural fever     @PCDICTATION @    Anders Simmonds T, DO 09/03/23 2157

## 2023-09-03 NOTE — ED Triage Notes (Signed)
 BIB EMS for post op issue, pt had ERPC today and has had a fever that is getting worse. C/o nausea, abdominal pain.  Hx of liver cancer and is on chemo.

## 2023-09-08 LAB — CULTURE, BLOOD (ROUTINE X 2)
Culture: NO GROWTH
Culture: NO GROWTH
Special Requests: ADEQUATE
Special Requests: ADEQUATE

## 2023-10-16 ENCOUNTER — Ambulatory Visit (HOSPITAL_COMMUNITY)
Admission: EM | Admit: 2023-10-16 | Discharge: 2023-10-16 | Disposition: A | Discharge status: Home / Self Care | Attending: Family Medicine | Admitting: Family Medicine

## 2023-10-16 ENCOUNTER — Encounter (HOSPITAL_COMMUNITY): Payer: Self-pay | Admitting: *Deleted

## 2023-10-16 ENCOUNTER — Other Ambulatory Visit: Payer: Self-pay

## 2023-10-16 DIAGNOSIS — R339 Retention of urine, unspecified: Secondary | ICD-10-CM | POA: Insufficient documentation

## 2023-10-16 DIAGNOSIS — C221 Intrahepatic bile duct carcinoma: Secondary | ICD-10-CM | POA: Diagnosis not present

## 2023-10-16 DIAGNOSIS — N3000 Acute cystitis without hematuria: Secondary | ICD-10-CM | POA: Diagnosis present

## 2023-10-16 LAB — POCT URINALYSIS DIP (MANUAL ENTRY)
Bilirubin, UA: NEGATIVE
Blood, UA: NEGATIVE
Glucose, UA: NEGATIVE mg/dL
Nitrite, UA: NEGATIVE
Protein Ur, POC: 30 mg/dL — AB
Spec Grav, UA: >=1.03 — AB (ref 1.010–1.025)
Urobilinogen, UA: 0.2 U/dL
pH, UA: 6 (ref 5.0–8.0)

## 2023-10-16 LAB — COMPREHENSIVE METABOLIC PANEL WITH GFR
ALT: 56 U/L — ABNORMAL HIGH (ref 0–44)
AST: 43 U/L — ABNORMAL HIGH (ref 15–41)
Albumin: 3.6 g/dL (ref 3.5–5.0)
Alkaline Phosphatase: 164 U/L — ABNORMAL HIGH (ref 38–126)
Anion gap: 8 (ref 5–15)
BUN: 12 mg/dL (ref 6–20)
CO2: 24 mmol/L (ref 22–32)
Calcium: 8.8 mg/dL — ABNORMAL LOW (ref 8.9–10.3)
Chloride: 107 mmol/L (ref 98–111)
Creatinine, Ser: 0.86 mg/dL (ref 0.44–1.00)
GFR, Estimated: >60 mL/min (ref >60)
Glucose, Bld: 102 mg/dL — ABNORMAL HIGH (ref 70–99)
Potassium: 3.7 mmol/L (ref 3.5–5.1)
Sodium: 139 mmol/L (ref 135–145)
Total Bilirubin: 0.7 mg/dL (ref 0.0–1.2)
Total Protein: 6.4 g/dL — ABNORMAL LOW (ref 6.5–8.1)

## 2023-10-16 LAB — CBC
HCT: 36.9 % (ref 36.0–46.0)
Hemoglobin: 11.7 g/dL — ABNORMAL LOW (ref 12.0–15.0)
MCH: 28.8 pg (ref 26.0–34.0)
MCHC: 31.7 g/dL (ref 30.0–36.0)
MCV: 90.9 fL (ref 80.0–100.0)
Platelets: 100 10*3/uL — ABNORMAL LOW (ref 150–400)
RBC: 4.06 MIL/uL (ref 3.87–5.11)
RDW: 15.9 % — ABNORMAL HIGH (ref 11.5–15.5)
WBC: 4.9 10*3/uL (ref 4.0–10.5)
nRBC: 0 % (ref 0.0–0.2)

## 2023-10-16 MED ORDER — PHENAZOPYRIDINE HCL 100 MG PO TABS: 100.0000 mg | ORAL_TABLET | Freq: Three times a day (TID) | ORAL | 0 refills | Status: AC | PRN

## 2023-10-16 MED ORDER — NITROFURANTOIN MONOHYD MACRO 100 MG PO CAPS: 100.0000 mg | ORAL_CAPSULE | Freq: Two times a day (BID) | ORAL | 0 refills | Status: AC

## 2023-10-16 NOTE — Discharge Instructions (Addendum)
 The urinalysis shows a little bit of white blood cells, which could be consistent with a bladder infection.  Take nitrofurantoin 100 mg--1 capsule 2 times daily for 5 days  Take Pyridium/phenazopyridine 100 mg--1 tablet 3 times daily as needed for urinary pain.  This medication usually makes the urine orange  Urine culture is sent, and staff will notify you that it looks like the antibiotic needs to be changed.  We have drawn blood to check blood counts, kidney and liver function numbers and sodium and potassium.  Our staff will notify if there is anything significantly abnormal.  If you worsen in any way, or if you are not improving with the treatments provided, please go to the emergency room for further evaluation

## 2023-10-16 NOTE — ED Triage Notes (Signed)
 PT reports fluid retension because she is bloated. Pt has dysuria and lower ABD pain.

## 2023-10-16 NOTE — ED Provider Notes (Signed)
 [facility]    CSN: [id number] Arrival date & time: 10/16/23  4098      History   Chief Complaint Chief Complaint  Patient presents with   Abdominal Pain   Dysuria    HPI Melinda White is a 53 y.o. female.    Abdominal Pain Associated symptoms: dysuria   Dysuria Associated symptoms: abdominal pain    Here for dysuria it has been going on since last evening.  For at least 2 weeks she has been having trouble with urinary hesitancy and possible retention.  Urinalysis showed some leukocytes on May 1, reviewed in care everywhere.  The urine culture showed 10-50,000 colony count of nonbeta-hemolytic gram-positive organism.  No sensitivities were done  She has felt bloated and takes Lasix, which usually helps.  She has a history of bile duct cancer.  Last chemotherapy was May 1 and she will be having it again on May 15.  Last labs were reviewed from May 1, and her liver enzymes were normal except for an alk phos of 167.  Also, her renal function was normal.  Potassium was low at 2.8.  CBC showed some mild anemia with a hemoglobin of 10.  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  She states she has been on that for about 2 years.  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 No pertinent surgical history.  OB History   No obstetric history on file.      Home Medications    Prior to Admission medications   Medication Sig Start  Date End Date Taking? Authorizing Provider  ciprofloxacin (CIPRO) 500 MG tablet Take 500 mg by mouth. 10/04/23 10/18/23 Yes [provider]  nitrofurantoin, macrocrystal-monohydrate, (MACROBID) 100 MG capsule Take 1 capsule (100 mg total) by mouth 2 (two) times daily. 10/16/23  Yes Moss Berry K, MD  phenazopyridine (PYRIDIUM) 100 MG tablet Take 1 tablet (100 mg total) by mouth 3 (three) times daily as needed (urinary pain). 10/16/23  Yes Eisen Robenson K, MD  furosemide (LASIX) 20 MG tablet Take 20 mg by mouth daily.    [provider]  lidocaine-prilocaine (EMLA) cream Apply 1 Application topically once as needed (prior to port access). 12/27/20   [provider]  pantoprazole  (PROTONIX ) 40 MG tablet Take 40 mg by mouth every morning. 09/14/21   [provider]  polyethylene glycol powder (GLYCOLAX /MIRALAX ) 17 GM/SCOOP powder Take 17 g by mouth every morning. 09/27/20   [provider]  senna (SENOKOT) 8.6 MG tablet Take 1 tablet by mouth 2 (two) times daily. 09/27/20   [provider]    Family History Family History  Problem Relation Age of Onset   Heart disease Neg Hx     Social History Social History   Tobacco Use   Smoking status: Never   Smokeless tobacco: Never  Vaping Use   Vaping status: Never Used  Substance Use Topics   Alcohol use: Never  Drug use: Never     Allergies   Reglan [metoclopramide] and Penicillins   Review of Systems Review of Systems  Gastrointestinal:  Positive for abdominal pain.  Genitourinary:  Positive for dysuria.     Physical Exam Triage Vital Signs ED Triage Vitals  Encounter Vitals Group     BP 10/16/23 1023 104/74     Systolic BP Percentile --      Diastolic BP Percentile --      Pulse Rate 10/16/23 1023 70     Resp 10/16/23 1023 20     Temp 10/16/23 1023 98.1 F (36.7 C)     Temp src --      SpO2 10/16/23 1023 98 %     Weight --      Height --      Head Circumference --       Peak Flow --      Pain Score 10/16/23 1018 5     Pain Loc --      Pain Education --      Exclude from Growth Chart --    No data found.  Updated Vital Signs BP 104/74   Pulse 70   Temp 98.1 F (36.7 C)   Resp 20   SpO2 98%   Visual Acuity Right Eye Distance:   Left Eye Distance:   Bilateral Distance:    Right Eye Near:   Left Eye Near:    Bilateral Near:     Physical Exam Vitals reviewed.  Constitutional:      General: She is not in acute distress.    Appearance: She is not toxic-appearing.  HENT:     Mouth/Throat:     Mouth: Mucous membranes are moist.  Eyes:     Extraocular Movements: Extraocular movements intact.     Conjunctiva/sclera: Conjunctivae normal.     Pupils: Pupils are equal, round, and reactive to light.  Cardiovascular:     Rate and Rhythm: Normal rate and regular rhythm.     Heart sounds: No murmur heard. Pulmonary:     Effort: Pulmonary effort is normal. No respiratory distress.     Breath sounds: No stridor. No wheezing, rhonchi or rales.  Abdominal:     General: There is no distension.     Palpations: Abdomen is soft.     Tenderness: There is abdominal tenderness (suprapubic). There is no guarding.  Musculoskeletal:     Cervical back: Neck supple.  Lymphadenopathy:     Cervical: No cervical adenopathy.  Skin:    Capillary Refill: Capillary refill takes less than 2 seconds.     Coloration: Skin is not jaundiced or pale.  Neurological:     General: No focal deficit present.     Mental Status: She is alert and oriented to person, place, and time.  Psychiatric:        Behavior: Behavior normal.      UC Treatments / Results  Labs (all labs ordered are listed, but only abnormal results are displayed) Labs Reviewed  POCT URINALYSIS DIP (MANUAL ENTRY) - Abnormal; Notable for the following components:      Result Value   Clarity, UA cloudy (*)    Ketones, POC UA trace (5) (*)    Spec Grav, UA >=1.030 (*)    Protein Ur, POC =30  (*)    Leukocytes, UA Trace (*)    All other components within normal limits  URINE CULTURE  CBC  COMPREHENSIVE METABOLIC PANEL WITH GFR    EKG  Radiology No results found.  Procedures Procedures (including critical care time)  Medications Ordered in UC Medications - No data to display  Initial Impression / Assessment and Plan / UC Course  I have reviewed the triage vital signs and the nursing notes.  Pertinent labs & imaging results that were available during my care of the patient were reviewed by me and considered in my medical decision making (see chart for details).     UA shows a trace of leukocytes.  Nitrofurantoin is sent and to treat possible UTI and urine culture is sent.  We will notify her if the antibiotic needs to be changed.  CBC and CMP are drawn today, and our staff will notify her if anything is significantly abnormal. Final Clinical Impressions(s) / UC Diagnoses   Final diagnoses:  Acute cystitis without hematuria  Urinary retention  Cholangiocarcinoma [facility])     Discharge Instructions      The urinalysis shows a little bit of white blood cells, which could be consistent with a bladder infection.  Take nitrofurantoin 100 mg--1 capsule 2 times daily for 5 days  Take Pyridium/phenazopyridine 100 mg--1 tablet 3 times daily as needed for urinary pain.  This medication usually makes the urine orange  Urine culture is sent, and staff will notify you that it looks like the antibiotic needs to be changed.  We have drawn blood to check blood counts, kidney and liver function numbers and sodium and potassium.  Our staff will notify if there is anything significantly abnormal.  If you worsen in any way, or if you are not improving with the treatments provided, please go to the emergency room for further evaluation  ED Prescriptions     Medication Sig Dispense Auth. Provider   nitrofurantoin, macrocrystal-monohydrate, (MACROBID) 100 MG capsule Take 1  capsule (100 mg total) by mouth 2 (two) times daily. 10 capsule Yohanna Tow K, MD   phenazopyridine (PYRIDIUM) 100 MG tablet Take 1 tablet (100 mg total) by mouth 3 (three) times daily as needed (urinary pain). 10 tablet Ellsworth Haas Hermann Dottavio K, MD      PDMP not reviewed this encounter.   Ann Keto, MD 10/16/23 1048

## 2023-10-17 ENCOUNTER — Ambulatory Visit (HOSPITAL_COMMUNITY): Payer: Self-pay

## 2023-10-17 LAB — URINE CULTURE

## 2024-04-24 ENCOUNTER — Other Ambulatory Visit (HOSPITAL_COMMUNITY): Payer: Self-pay | Admitting: Oncology

## 2024-04-24 DIAGNOSIS — K769 Liver disease, unspecified: Secondary | ICD-10-CM

## 2024-04-24 DIAGNOSIS — K7689 Other specified diseases of liver: Secondary | ICD-10-CM

## 2024-04-25 ENCOUNTER — Other Ambulatory Visit (HOSPITAL_COMMUNITY): Payer: Self-pay | Admitting: Internal Medicine

## 2024-04-25 DIAGNOSIS — C221 Intrahepatic bile duct carcinoma: Secondary | ICD-10-CM

## 2024-04-25 DIAGNOSIS — R188 Other ascites: Secondary | ICD-10-CM

## 2024-05-02 ENCOUNTER — Other Ambulatory Visit (HOSPITAL_COMMUNITY)

## 2024-05-05 ENCOUNTER — Ambulatory Visit (HOSPITAL_COMMUNITY)
Admission: RE | Admit: 2024-05-05 | Discharge: 2024-05-05 | Disposition: A | Source: Ambulatory Visit | Attending: Internal Medicine | Admitting: Internal Medicine

## 2024-05-05 ENCOUNTER — Other Ambulatory Visit (HOSPITAL_COMMUNITY): Payer: Self-pay | Admitting: Internal Medicine

## 2024-05-05 DIAGNOSIS — C221 Intrahepatic bile duct carcinoma: Secondary | ICD-10-CM

## 2024-05-05 DIAGNOSIS — R188 Other ascites: Secondary | ICD-10-CM | POA: Insufficient documentation

## 2024-05-05 DIAGNOSIS — Z8505 Personal history of malignant neoplasm of liver: Secondary | ICD-10-CM | POA: Insufficient documentation

## 2024-05-05 NOTE — Progress Notes (Signed)
 Patient presented to radiology for paracentesis today.   Abdomen visualized with ultrasound.  No fluid collection for safe approach found.  Ultrasound images saved for documentation purpose.   Paracentesis was NOT performed.   Josselin Gaulin H Eaden Hettinger PA-C 05/05/2024 1:11 PM

## 2024-05-15 IMAGING — CT CT ABD-PELV W/ CM
2 of 4 series · 15 of 46 positions shown, 17 images · IV contrast (APPLIED)
Comparison: 10/14/2021

CLINICAL DATA: Abdominal pain, acute, nonlocalized hx
cholangiocarcinoma, concern for abscess, infection, now with fever
abd pain

EXAM:
CT ABDOMEN AND PELVIS WITH CONTRAST
TECHNIQUE: Multidetector CT imaging of the abdomen and pelvis was performed
using the standard protocol following bolus administration of
intravenous contrast.

[Series 2: abd pel w · axial · 0.63mm/px · z∈[-433,-48]mm · 12 of 89 slices shown, 14 images]
[im 8/89  soft-tissue]
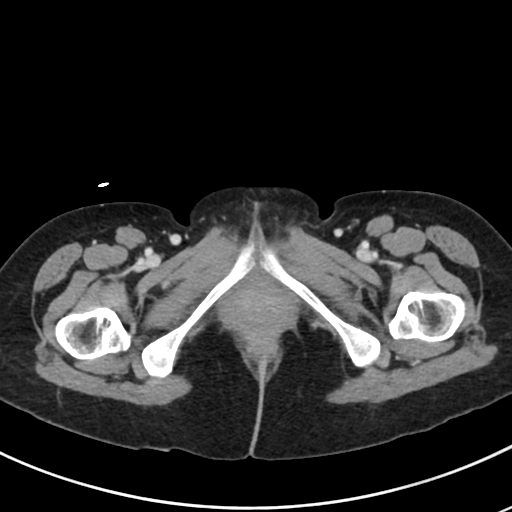
[im 8/89  bone]
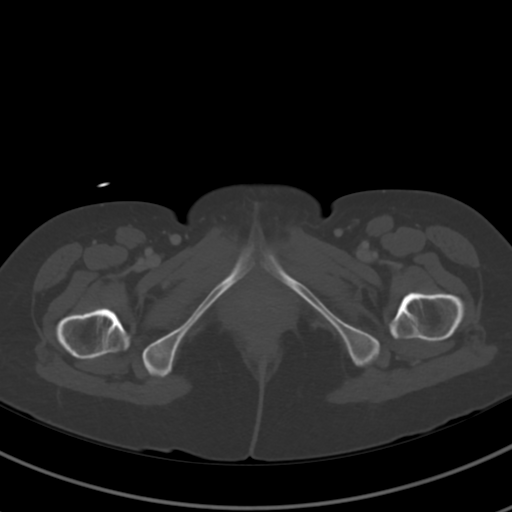
[im 15/89  soft-tissue]
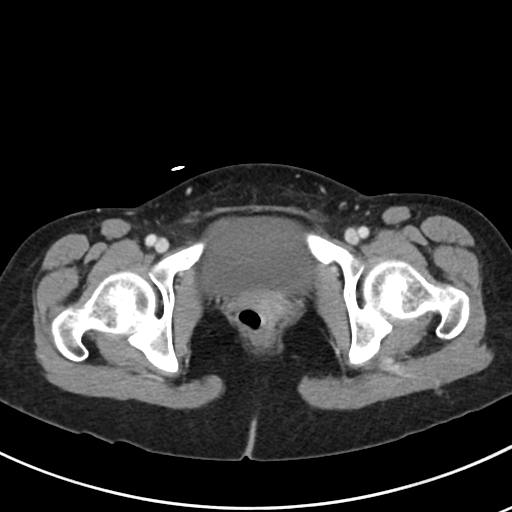
[im 22/89  soft-tissue]
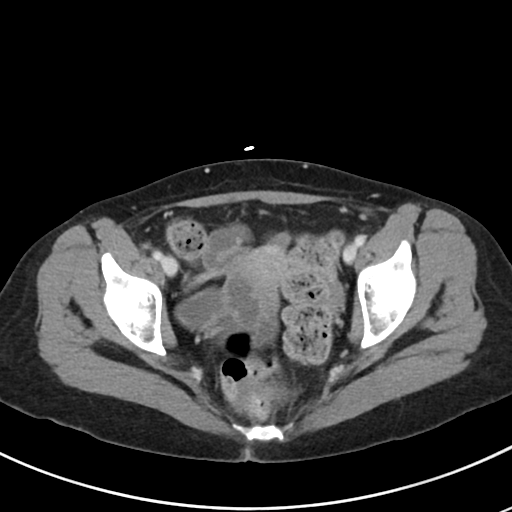
[im 29/89  soft-tissue]
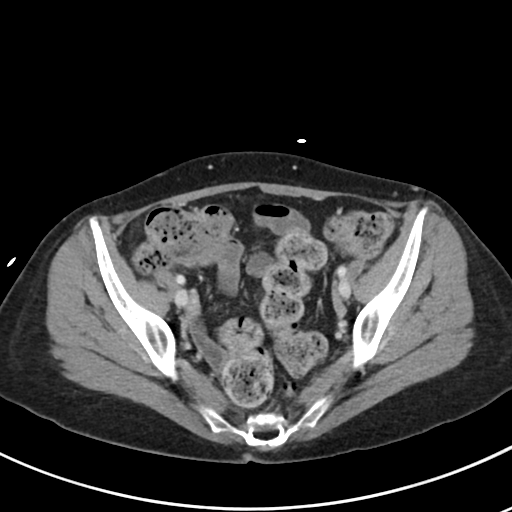
[im 36/89  soft-tissue]
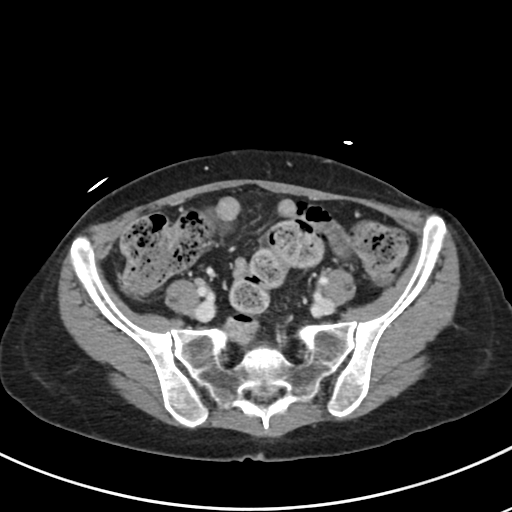
[im 43/89  soft-tissue]
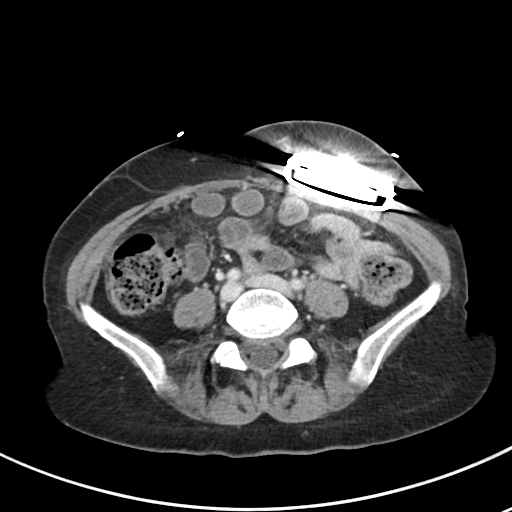
[im 50/89  soft-tissue]
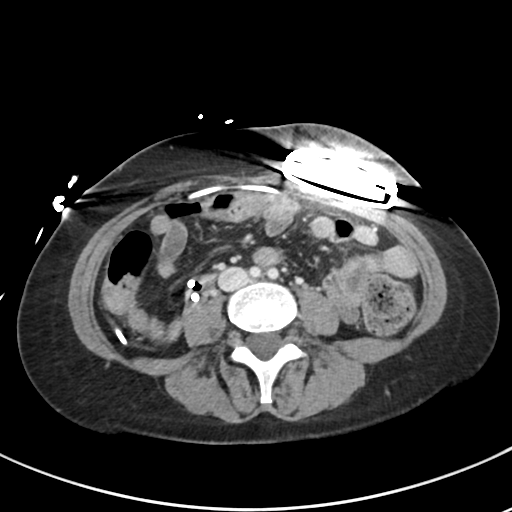
[im 57/89  soft-tissue]
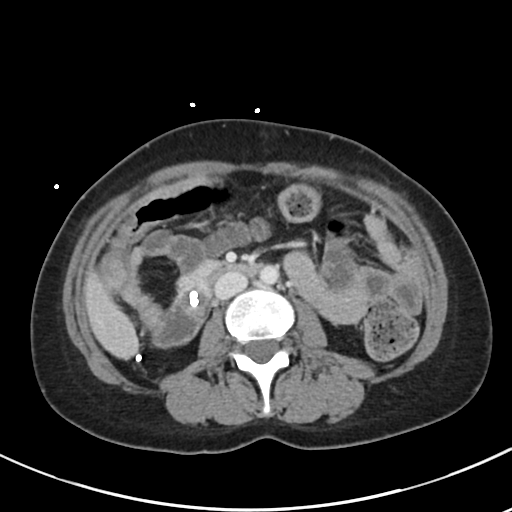
[im 64/89  soft-tissue]
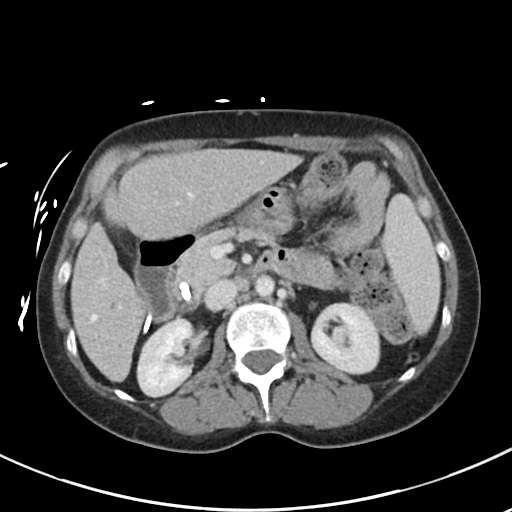
[im 64/89  bone]
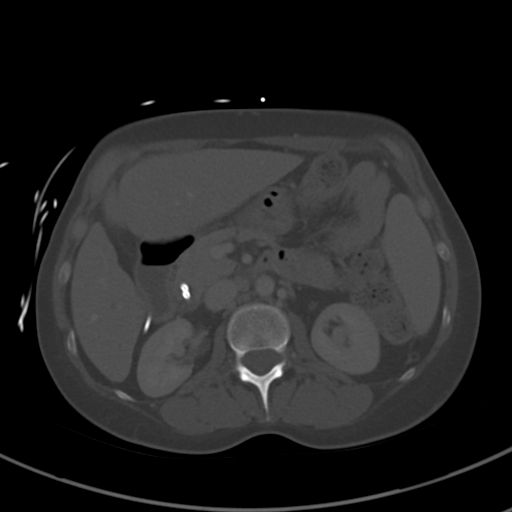
[im 71/89  soft-tissue]
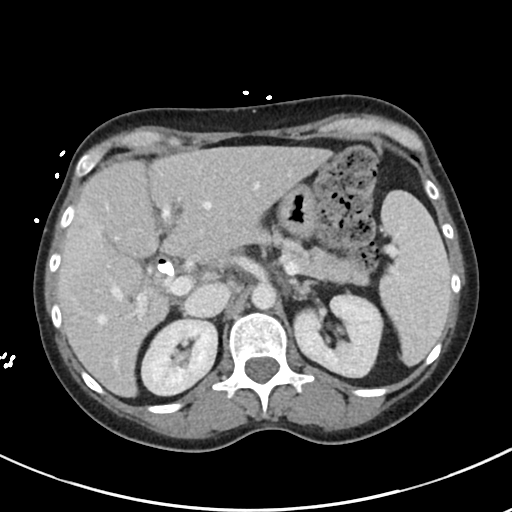
[im 78/89  soft-tissue]
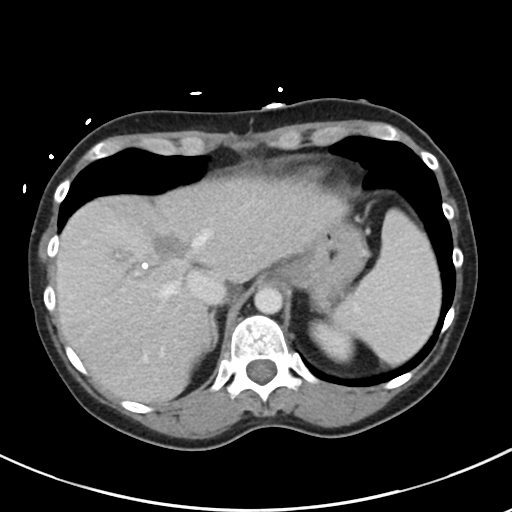
[im 85/89  soft-tissue]
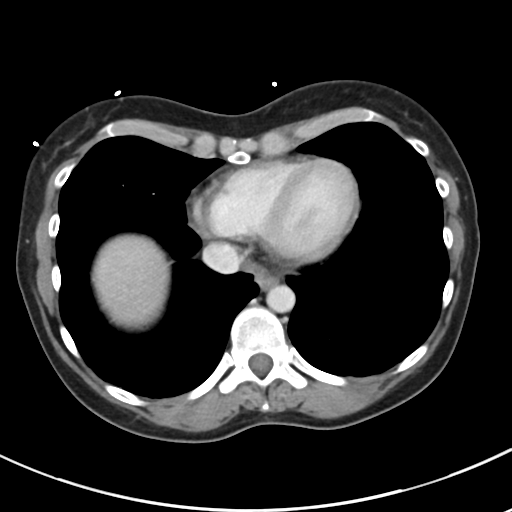

[Series 5: coronal · coronal · 0.66mm/px · 3 of 78 slices shown]
[im 26/78  soft-tissue]
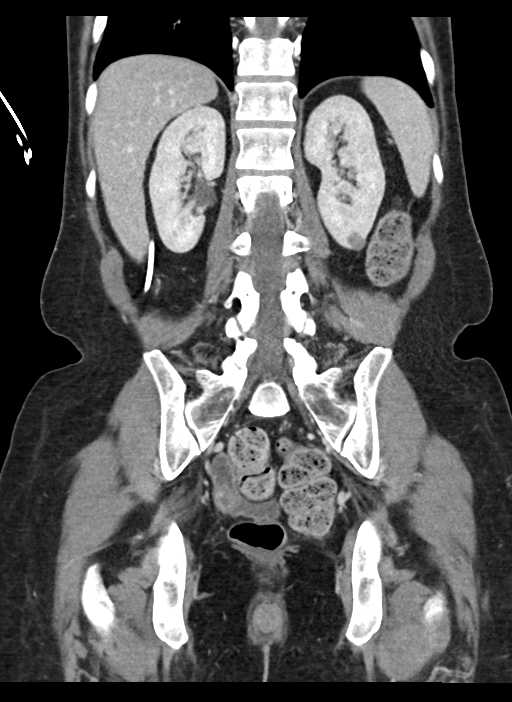
[im 35/78  soft-tissue]
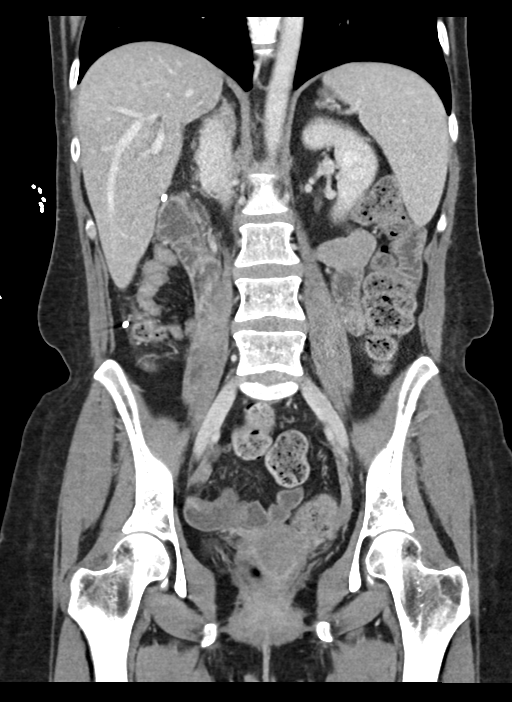
[im 43/78  soft-tissue]
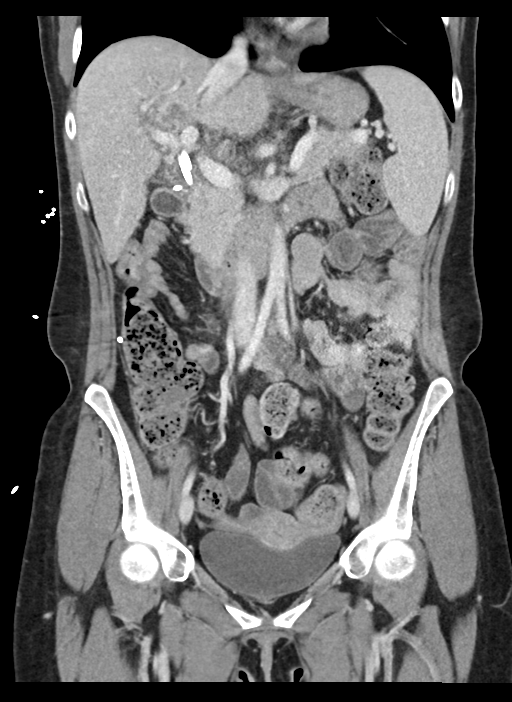

[15 of 46 positions shown; findings below may reference images not displayed]

RADIATION DOSE REDUCTION: This exam was performed according to the
departmental dose-optimization program which includes automated
exposure control, adjustment of the mA and/or kV according to
patient size and/or use of iterative reconstruction technique.

CONTRAST:  80mL OMNIPAQUE IOHEXOL 300 MG/ML  SOLN
FINDINGS: Lower chest: No acute abnormality.

Hepatobiliary: Infiltrative hypoenhancing mass is seen within the
porta hepatis compatible with the patient's known cholangiocarcinoma
and is grossly stable in size measuring roughly 3.4 x 4.1 cm at
axial image # [DATE]. The middle hepatic vein is chronically partially
occluded accounting for relative hyperenhancement within segment 5.
There is dual internal biliary stents in place, unchanged from prior
examination with their proximal tips terminating within the region
of the mass in segment segment 4 and just distal to the mass in
segment 8. There is mild intrahepatic biliary ductal dilation within
segments 6, 7, and 2. This appears stable when compared to prior
examination. The portal vein is patent. No new intrahepatic masses
are identified. Gallbladder absent.

Pancreas: Unremarkable

Spleen: Unremarkable

Adrenals/Urinary Tract: The adrenal glands are unremarkable. Simple
cortical cyst noted within the lower pole of the left kidney,
unchanged from prior examination. No further follow-up is
recommended for this lesion. The kidneys are otherwise unremarkable.
The bladder is unremarkable.

Stomach/Bowel: Moderate stool throughout the colon without evidence
of obstruction. The stomach, small bowel, and large bowel are
otherwise unremarkable. The appendix is not clearly identified and
may be absent. No free intraperitoneal gas or fluid. Left lower
quadrant anterior abdominal wall chemotherapy pump is unchanged with
its catheter tip terminating within the porta hepatis.

Vascular/Lymphatic: No significant vascular findings are present. No
enlarged abdominal or pelvic lymph nodes.

Reproductive: Uterus and bilateral adnexa are unremarkable.

Other: No abdominal wall hernia.

Musculoskeletal: No acute bone abnormality. No lytic or blastic bone
lesion.
IMPRESSION: 1. No acute intra-abdominal pathology identified. No definite
radiographic explanation for the patient's reported symptoms.
2. Stable infiltrative mass within the porta hepatis in keeping with
the patient's known cholangiocarcinoma. Stable mild intrahepatic
biliary ductal dilation.
3. Moderate stool throughout the colon without evidence of
obstruction.

## 2024-05-15 IMAGING — DX DG CHEST 2V
2 series · 2 of 2 positions shown · non-contrast
Comparison: Chest x-ray dated October 14, 2021

CLINICAL DATA: Concern for sepsis

EXAM:
CHEST - 2 VIEW

[chest pa]
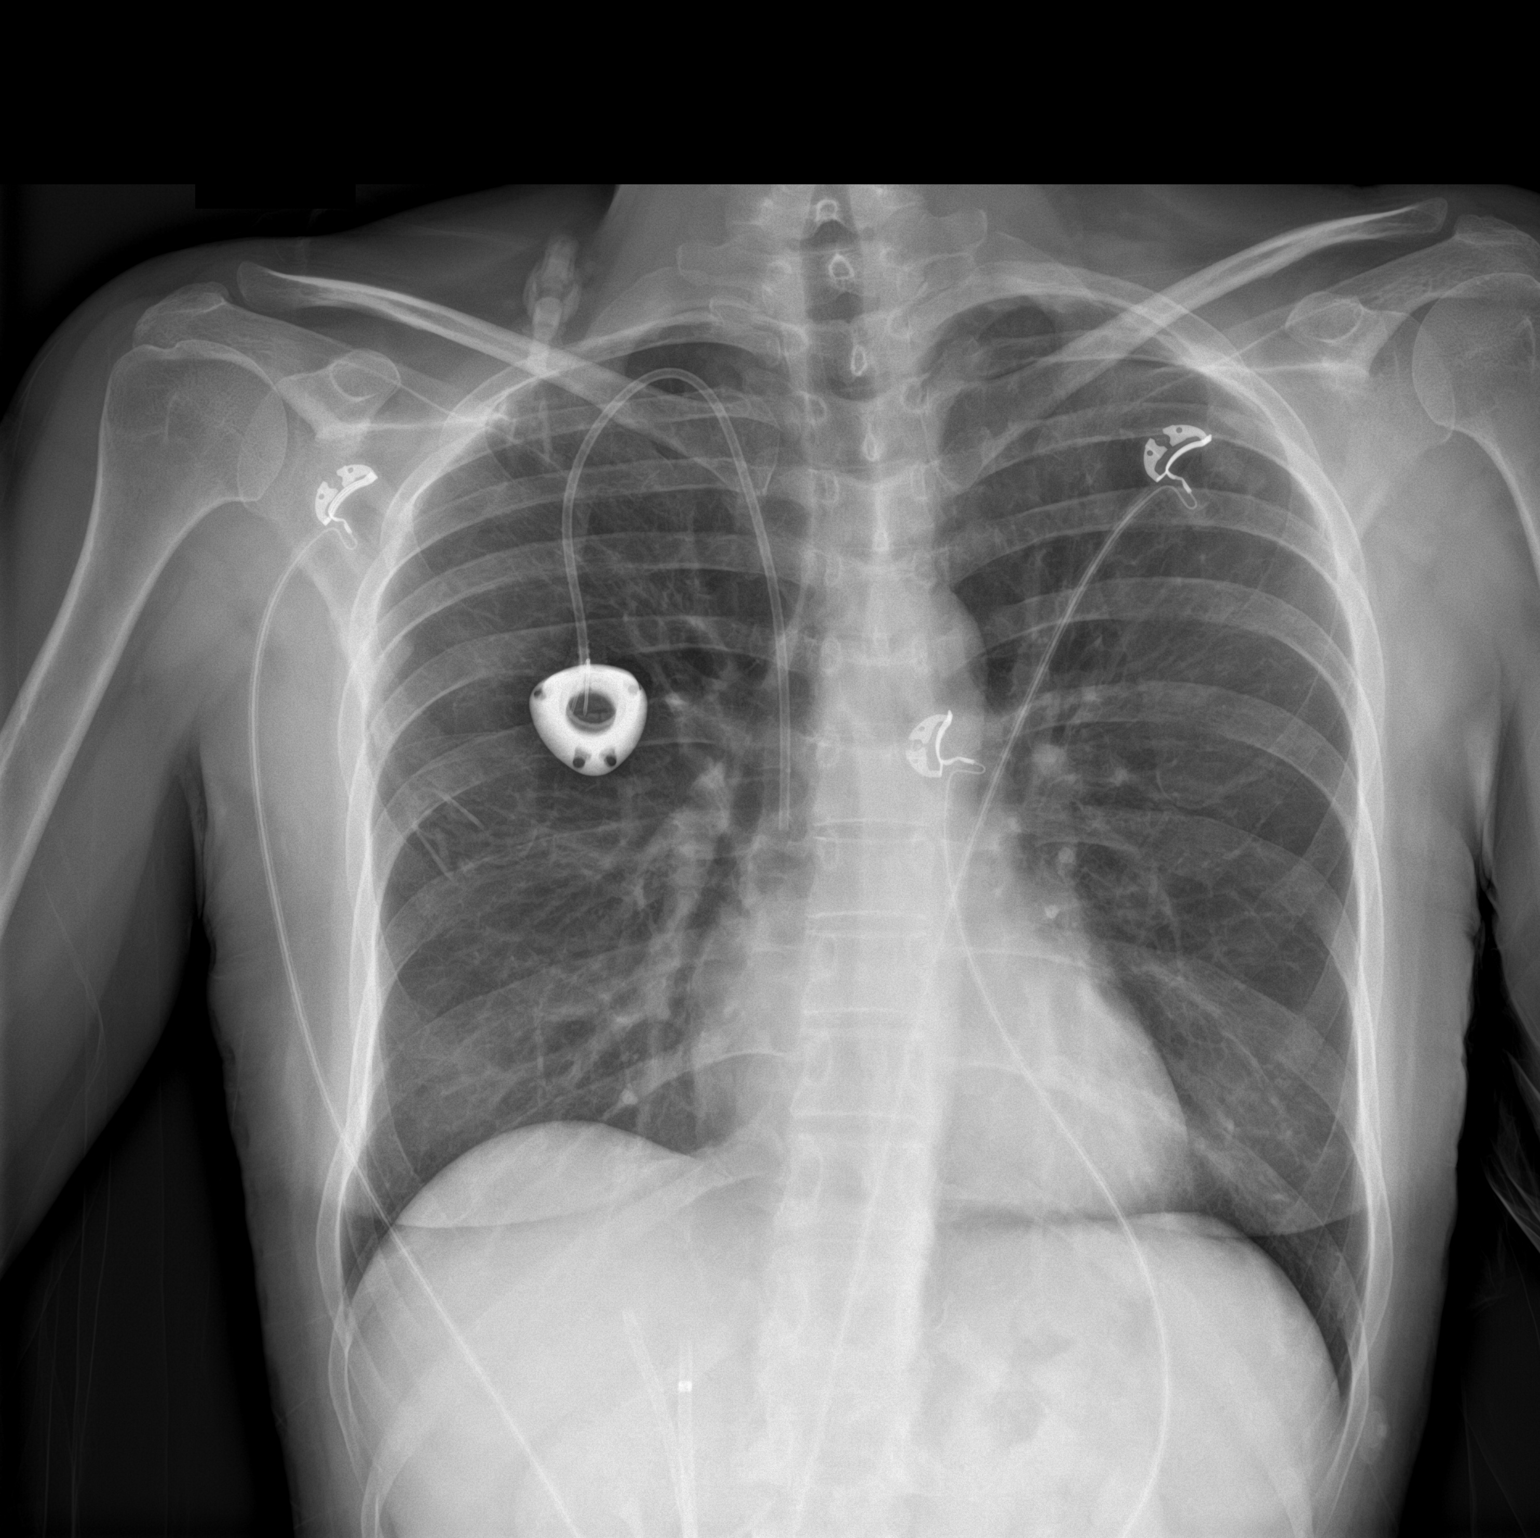

[chest lat]
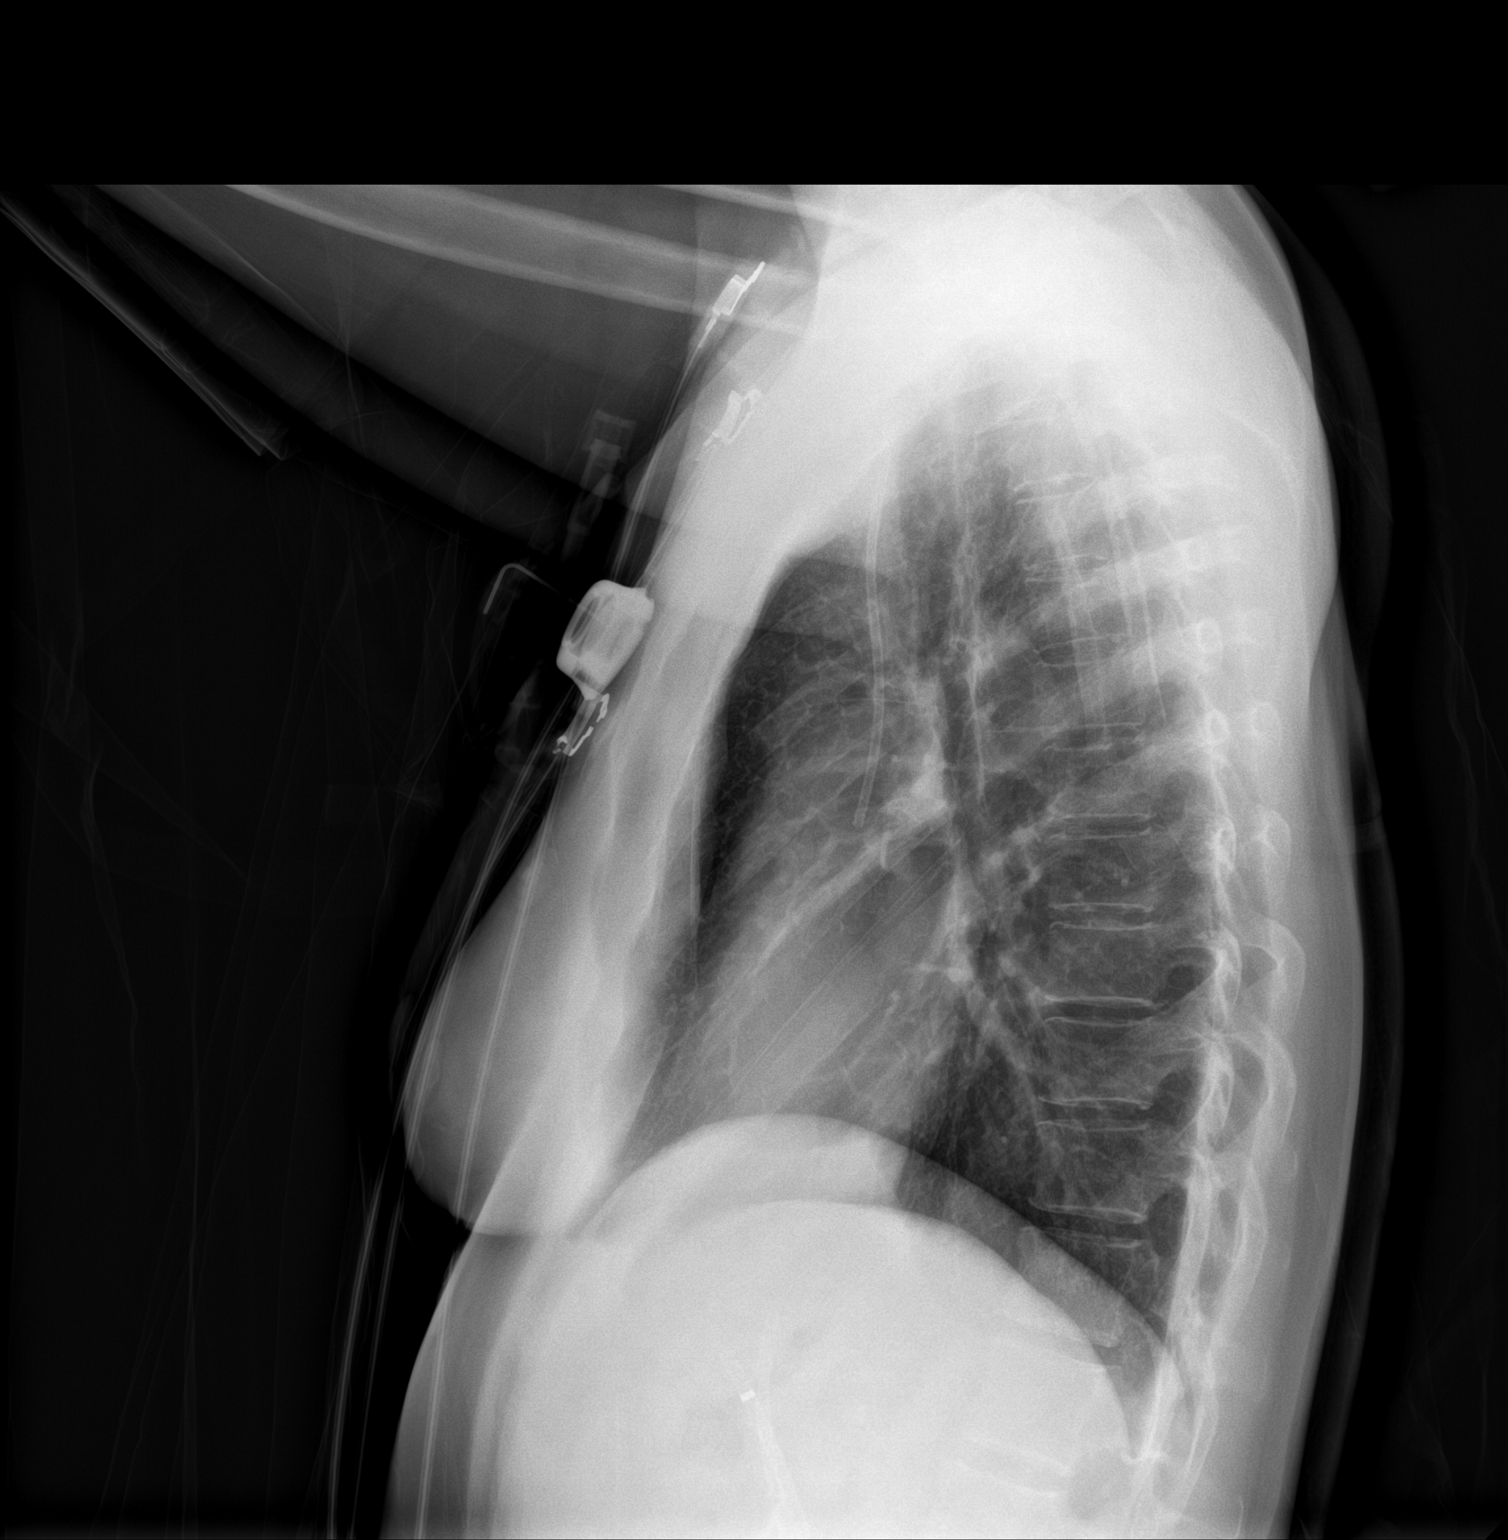

[2 of 2 positions shown; findings below may reference images not displayed]

FINDINGS: Stable position of right chest wall port. The heart size and
mediastinal contours are within normal limits. Both lungs are clear.
The visualized skeletal structures are unremarkable.
IMPRESSION: No active cardiopulmonary disease.

## 2024-05-21 ENCOUNTER — Encounter: Payer: Self-pay | Admitting: *Deleted
